# Patient Record
Sex: Female | Born: 1980
Health system: Southern US, Community
[De-identification: ages and names within clinical notes are randomized; demographics above are authoritative.]

## PROBLEM LIST (undated history)

## (undated) DIAGNOSIS — G43909 Migraine, unspecified, not intractable, without status migrainosus: Secondary | ICD-10-CM

## (undated) DIAGNOSIS — E669 Obesity, unspecified: Secondary | ICD-10-CM

## (undated) DIAGNOSIS — E785 Hyperlipidemia, unspecified: Secondary | ICD-10-CM

## (undated) DIAGNOSIS — I10 Essential (primary) hypertension: Secondary | ICD-10-CM

## (undated) DIAGNOSIS — K5090 Crohn's disease, unspecified, without complications: Secondary | ICD-10-CM

## (undated) DIAGNOSIS — E559 Vitamin D deficiency, unspecified: Secondary | ICD-10-CM

## (undated) HISTORY — PX: APPENDECTOMY: SHX54

## (undated) HISTORY — DX: Hyperlipidemia, unspecified: E78.5

## (undated) HISTORY — DX: Obesity, unspecified: E66.9

## (undated) HISTORY — DX: Crohn's disease, unspecified, without complications: K50.90

## (undated) HISTORY — DX: Essential (primary) hypertension: I10

## (undated) HISTORY — DX: Vitamin D deficiency, unspecified: E55.9

## (undated) HISTORY — DX: Migraine, unspecified, not intractable, without status migrainosus: G43.909

---

## 2006-12-13 ENCOUNTER — Encounter: Admission: RE | Admit: 2006-12-13 | Discharge: 2006-12-13 | Payer: Self-pay | Admitting: Internal Medicine

## 2007-01-08 ENCOUNTER — Ambulatory Visit: Payer: Self-pay | Admitting: Internal Medicine

## 2007-01-08 LAB — CONVERTED CEMR LAB
Crystals: NEGATIVE
Hemoglobin, Urine: NEGATIVE
Mucus, UA: NEGATIVE
Total Protein, Urine: NEGATIVE mg/dL
pH: 6 (ref 5.0–8.0)

## 2007-01-10 ENCOUNTER — Ambulatory Visit: Payer: Self-pay | Admitting: Gastroenterology

## 2007-01-29 ENCOUNTER — Ambulatory Visit: Payer: Self-pay | Admitting: Internal Medicine

## 2007-11-21 ENCOUNTER — Inpatient Hospital Stay (HOSPITAL_COMMUNITY): Admission: AD | Admit: 2007-11-21 | Discharge: 2007-11-24 | Payer: Self-pay | Admitting: Obstetrics & Gynecology

## 2010-11-29 NOTE — Assessment & Plan Note (Signed)
[facility]                         GASTROENTEROLOGY OFFICE NOTE   JANNINE, ABREU                      MRN:          [id number]  DATE:01/08/2007                            DOB:          [birth year]    REFERRING PHYSICIAN:  Geoffry Paradise, M.D.   REASON FOR CONSULTATION:  Right-sided pain.   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 Jacky Kindle regarding persistent  right-sided pain. She is accompanied today by her husband. The patient  reports developing right side to right flank pain about 2 months ago.  She describes the discomfort as occurring approximately 30 minutes after  awakening. The discomfort can vary during the day. It seems to be worse  with exertion or activity. It seems to be improved with lying down and  applying direct pressure such as a pillow. The pain does not awake her.  It is not affected by meals or stress. She denies nausea, vomiting,  screw abdominal pain, change in bowel habits, fevers, bleeding, or  urinary complaints. She has taken ibuprofen which has not been helpful.  There does not seem to be any change related to her menstrual period.  She saw Dr. Jacky Kindle on May 12 and underwent laboratory testing.  Comprehensive metabolic panel including liver function tests and CBC  were normal. Urinalysis revealed 10-15 white blood cells as well as 5-10  red blood cells, small nitrate and 2+ bacteria. A contrast-enhanced CT  scan of the abdomen and pelvis was performed Dec 13, 2006. The abdomen  revealed a questionable abnormality of the ascending colon with possible  narrowing or slight mucosal thickening.  No surrounding inflammatory  change. The pelvis was unremarkable. The patient denies trauma or prior  history of similar pain.   PAST MEDICAL HISTORY:  None.   PAST SURGICAL HISTORY:  Appendectomy in 1983.   ALLERGIES:  No known drug allergies.   CURRENT MEDICATIONS:  None.   FAMILY HISTORY:  Mother with ulcerative colitis and father with heart  disease and diabetes.   SOCIAL HISTORY:  The patient is married. She is accompanied by her  husband Homer. They do not have children. 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 She does not smoke or use alcohol.   REVIEW OF SYSTEMS:  Remarkable for back pain. Her last menstrual period  began December 25, 2006 and it was normal.   PHYSICAL EXAMINATION:  GENERAL:  Well-appearing female in no acute  distress.  VITAL SIGNS:  Blood pressure is 116/70, heart rate is 90, weight is  181.6 pounds. She is 5 feet 2 inches in height.  HEENT:  Sclera are anicteric, conjunctiva are pink. Oral mucosa is  intact. There is no adenopathy.  LUNGS:  Clear.  HEART:  Regular.  ABDOMEN:  Soft, nontender, nondistended with good bowel sounds. No  organomegaly, mass or hernia. There is no pain to percussion in the  flanks or to palpation along the spine.  EXTREMITIES:  Without edema.  IMPRESSION:  This is a 30 year old female with a 6-8 week history of  right-sided and right flank pain. No true abdominal pain. The problem  does seem to have a musculoskeletal component. There is however a  question of an abnormality of the right colon on CT imaging. This  appears soft. However, given the persistent nature of her pain as well  as her family history of inflammatory bowel disease, I feel further  investigation is indicated. The only other issue is a prior abnormal  urinalysis of uncertain significance.   RECOMMENDATIONS:  1. Repeat urinalysis.  2. Abdominal ultrasound to rule out gallstones.  3. Schedule colonoscopy to clarify CT scan abnormality and exclude      colonic cause for pain. The nature of the procedure as well as the      risks, benefits, and alternatives were reviewed. She understood and      agreed to proceed.  4. If GI  workup negative then consider possible musculoskeletal cause      such as back or spine disease.     Wilhemina Bonito. Marina Goodell, MD  Electronically Signed    JNP/MedQ  DD: 01/10/2007  DT: 01/11/2007  Job #: [id number]   cc:   Geoffry Paradise, M.D.

## 2013-03-13 ENCOUNTER — Other Ambulatory Visit: Payer: Self-pay

## 2013-03-13 ENCOUNTER — Encounter (HOSPITAL_COMMUNITY): Payer: Self-pay | Admitting: Emergency Medicine

## 2013-03-13 ENCOUNTER — Emergency Department (HOSPITAL_COMMUNITY): Payer: 59

## 2013-03-13 ENCOUNTER — Emergency Department (HOSPITAL_COMMUNITY)
Admission: EM | Admit: 2013-03-13 | Discharge: 2013-03-13 | Disposition: A | Discharge status: Home / Self Care | Payer: 59 | Attending: Emergency Medicine | Admitting: Emergency Medicine

## 2013-03-13 DIAGNOSIS — Z79899 Other long term (current) drug therapy: Secondary | ICD-10-CM | POA: Insufficient documentation

## 2013-03-13 DIAGNOSIS — R0602 Shortness of breath: Secondary | ICD-10-CM | POA: Insufficient documentation

## 2013-03-13 DIAGNOSIS — R002 Palpitations: Secondary | ICD-10-CM | POA: Insufficient documentation

## 2013-03-13 DIAGNOSIS — M546 Pain in thoracic spine: Secondary | ICD-10-CM | POA: Insufficient documentation

## 2013-03-13 LAB — CBC WITH DIFFERENTIAL/PLATELET
Eosinophils Absolute: 0.2 10*3/uL (ref 0.0–0.7)
Hemoglobin: 14 g/dL (ref 12.0–15.0)
Lymphs Abs: 2.9 10*3/uL (ref 0.7–4.0)
MCH: 29.9 pg (ref 26.0–34.0)
Monocytes Relative: 4 % (ref 3–12)
Neutrophils Relative %: 69 % (ref 43–77)
RBC: 4.68 MIL/uL (ref 3.87–5.11)

## 2013-03-13 LAB — COMPREHENSIVE METABOLIC PANEL
Alkaline Phosphatase: 56 U/L (ref 39–117)
BUN: 8 mg/dL (ref 6–23)
Chloride: 102 mEq/L (ref 96–112)
GFR calc Af Amer: >90 mL/min (ref >90)
GFR calc non Af Amer: >90 mL/min (ref >90)
Glucose, Bld: 86 mg/dL (ref 70–99)
Potassium: 3.3 mEq/L — ABNORMAL LOW (ref 3.5–5.1)
Total Bilirubin: 0.3 mg/dL (ref 0.3–1.2)
Total Protein: 7.7 g/dL (ref 6.0–8.3)

## 2013-03-13 LAB — POCT I-STAT TROPONIN I: Troponin i, poc: 0 ng/mL (ref 0.00–0.08)

## 2013-03-13 NOTE — ED Provider Notes (Signed)
Medical screening examination/treatment/procedure(s) were performed by non-physician practitioner and as supervising physician I was immediately available for consultation/collaboration.   Junius Argyle, MD 03/13/13 1958

## 2013-03-13 NOTE — ED Provider Notes (Signed)
CSN: [id number]     Arrival date & time 03/13/13  1141 History   First MD Initiated Contact with Patient 03/13/13 1154     Chief Complaint  Patient presents with  . Palpitations  . Back Pain   (Consider location/radiation/quality/duration/timing/severity/associated sxs/prior Treatment) HPI Comments: Patient presents to the emergency department with chief complaint of palpitations. She states that she has noticed increasingly frequent heart palpitations times one month. She states that she has also noticed that she becomes short of breath with exertion for the past month. She states that she has also had some upper back pain, that radiates to the shoulder blade. She states that she has a family history of early heart disease. She does not have any other cardiac risk factors. She states that nothing makes her symptoms better or worse. She has not tried any medications for symptoms. He denies being in pain at this time. She denies chest pain, abdominal pain, urinary complaints, or other complaints.  The history is provided by the patient. No language interpreter was used.    History reviewed. No pertinent past medical history. Past Surgical History  Procedure Laterality Date  . Appendectomy     No family history on file. History  Substance Use Topics  . Smoking status: Not on file  . Smokeless tobacco: Not on file  . Alcohol Use: Not on file   OB History   Grav Para Term Preterm Abortions TAB SAB Ect Mult Living                 Review of Systems  All other systems reviewed and are negative.    Allergies  Review of patient's allergies indicates no known allergies.  Home Medications   Current Outpatient Rx  Name  Route  Sig  Dispense  Refill  . desogestrel-ethinyl estradiol (KARIVA,AZURETTE,MIRCETTE) 0.15-0.02/0.01 MG (21/5) tablet   Oral   Take 1 tablet by mouth daily.         . naproxen sodium (ANAPROX) 220 MG tablet   Oral   Take 440 mg by mouth 2 (two) times daily as  needed (for pain).          BP 121/99  Pulse 89  Temp(Src) 98.5 F (36.9 C) (Oral)  SpO2 100%  LMP 03/11/2013 Physical Exam  Nursing note and vitals reviewed. Constitutional: She is oriented to person, place, and time. She appears well-developed and well-nourished.  HENT:  Head: Normocephalic and atraumatic.  Eyes: Conjunctivae and EOM are normal. Pupils are equal, round, and reactive to light.  Neck: Normal range of motion. Neck supple.  Cardiovascular: Normal rate and regular rhythm.  Exam reveals no gallop and no friction rub.   No murmur heard. Pulmonary/Chest: Effort normal and breath sounds normal. No respiratory distress. She has no wheezes. She has no rales. She exhibits no tenderness.  Abdominal: Soft. Bowel sounds are normal. She exhibits no distension and no mass. There is no tenderness. There is no rebound and no guarding.  Musculoskeletal: Normal range of motion. She exhibits no edema and no tenderness.  Neurological: She is alert and oriented to person, place, and time.  Skin: Skin is warm and dry.  Psychiatric: She has a normal mood and affect. Her behavior is normal. Judgment and thought content normal.    ED Course  Procedures (including critical care time) Labs Review Labs Reviewed  CBC WITH DIFFERENTIAL  COMPREHENSIVE METABOLIC PANEL  ED ECG REPORT  I personally interpreted this EKG   Date: 03/13/2013   Rate:  95  Rhythm: normal sinus rhythm  QRS Axis: normal  Intervals: normal  ST/T Wave abnormalities: normal  Conduction Disutrbances:none  Narrative Interpretation:   Old EKG Reviewed: none available   Results for orders placed during the hospital encounter of 03/13/13  CBC WITH DIFFERENTIAL      Result Value Range   WBC 11.5 (*) 4.0 - 10.5 K/uL   RBC 4.68  3.87 - 5.11 MIL/uL   Hemoglobin 14.0  12.0 - 15.0 g/dL   HCT 16.1  09.6 - 04.5 %   MCV 89.3  78.0 - 100.0 fL   MCH 29.9  26.0 - 34.0 pg   MCHC 33.5  30.0 - 36.0 g/dL   RDW 40.9  81.1 - 91.4  %   Platelets 238  150 - 400 K/uL   Neutrophils Relative % 69  43 - 77 %   Neutro Abs 7.9 (*) 1.7 - 7.7 K/uL   Lymphocytes Relative 26  12 - 46 %   Lymphs Abs 2.9  0.7 - 4.0 K/uL   Monocytes Relative 4  3 - 12 %   Monocytes Absolute 0.5  0.1 - 1.0 K/uL   Eosinophils Relative 1  0 - 5 %   Eosinophils Absolute 0.2  0.0 - 0.7 K/uL   Basophils Relative 0  0 - 1 %   Basophils Absolute 0.1  0.0 - 0.1 K/uL  COMPREHENSIVE METABOLIC PANEL      Result Value Range   Sodium 138  135 - 145 mEq/L   Potassium 3.3 (*) 3.5 - 5.1 mEq/L   Chloride 102  96 - 112 mEq/L   CO2 24  19 - 32 mEq/L   Glucose, Bld 86  70 - 99 mg/dL   BUN 8  6 - 23 mg/dL   Creatinine, Ser 7.82  0.50 - 1.10 mg/dL   Calcium 9.4  8.4 - 95.6 mg/dL   Total Protein 7.7  6.0 - 8.3 g/dL   Albumin 3.9  3.5 - 5.2 g/dL   AST 19  0 - 37 U/L   ALT 20  0 - 35 U/L   Alkaline Phosphatase 56  39 - 117 U/L   Total Bilirubin 0.3  0.3 - 1.2 mg/dL   GFR calc non Af Amer >90  >90 mL/min   GFR calc Af Amer >90  >90 mL/min  D-DIMER, QUANTITATIVE      Result Value Range   D-Dimer, Quant <0.27  0.00 - 0.48 ug/mL-FEU  POCT I-STAT TROPONIN I      Result Value Range   Troponin i, poc 0.00  0.00 - 0.08 ng/mL   Comment 3            Dg Chest 2 View  03/13/2013   *RADIOLOGY REPORT*  Clinical Data: Shortness of breath, palpitations  CHEST - 2 VIEW  Comparison: None.  Findings: Cardiomediastinal silhouette is unremarkable.  Minimal dextroscoliosis.  No acute infiltrate or pleural effusion.  No pulmonary edema.  IMPRESSION: No active disease.   Original Report Authenticated By: Natasha Mead, M.D.      MDM   1. Palpitations    Patient with one-month history of heart palpitations. Cardiac risk factors only include positive family history. Doubt PE, no chest pain or hypoxia, no recent travel or surgery.  Labs are reassuring.  Will recommend follow-up with cardiology. Discussed the patient with Dr. Romeo Apple, who agrees with the plan.  Heart score is 1.   Low Score (0-3 points), risk of MACE of 0.9-1.7%.  Molly Maduro  Dahlia Client, PA-C 03/13/13 [phone]

## 2013-03-13 NOTE — ED Notes (Signed)
Pt states that for approx 1 month now she feels palpatations and upper back pain to shoulder blade area, denies any hx, pain intermit. No sob,

## 2014-04-21 ENCOUNTER — Encounter (HOSPITAL_COMMUNITY): Payer: Self-pay | Admitting: Emergency Medicine

## 2014-04-21 ENCOUNTER — Emergency Department (HOSPITAL_COMMUNITY)
Admission: EM | Admit: 2014-04-21 | Discharge: 2014-04-21 | Disposition: A | Discharge status: Home / Self Care | Payer: 59 | Source: Home / Self Care

## 2014-04-21 DIAGNOSIS — R0982 Postnasal drip: Secondary | ICD-10-CM

## 2014-04-21 DIAGNOSIS — J9801 Acute bronchospasm: Secondary | ICD-10-CM

## 2014-04-21 DIAGNOSIS — J069 Acute upper respiratory infection, unspecified: Secondary | ICD-10-CM

## 2014-04-21 MED ORDER — GUAIFENESIN-CODEINE 100-10 MG/5ML PO SYRP: ORAL_SOLUTION | ORAL | Status: DC

## 2014-04-21 MED ORDER — ALBUTEROL SULFATE HFA 108 (90 BASE) MCG/ACT IN AERS: 2.0000 | INHALATION_SPRAY | RESPIRATORY_TRACT | Status: DC | PRN

## 2014-04-21 NOTE — ED Provider Notes (Signed)
CSN: [id number]     Arrival date & time 04/21/14  [id number] History   First MD Initiated Contact with Patient 04/21/14 [id number]     Chief Complaint  Patient presents with  . Cough   (Consider location/radiation/quality/duration/timing/severity/associated sxs/prior Treatment) HPI Comments: Cough for 1 week, worse at night. Associated with body aches and fever 3-4 days ago. Taking Mucinex DM and Ibuprofen. No hx of asthma or smoking.   History reviewed. No pertinent past medical history. Past Surgical History  Procedure Laterality Date  . Appendectomy     No family history on file. History  Substance Use Topics  . Smoking status: Never Smoker   . Smokeless tobacco: Not on file  . Alcohol Use: No   OB History   Grav Para Term Preterm Abortions TAB SAB Ect Mult Living                 Review of Systems  Constitutional: Negative for fever, chills, activity change, appetite change and fatigue.  HENT: Positive for congestion and postnasal drip. Negative for facial swelling, rhinorrhea and sore throat.   Eyes: Negative.   Respiratory: Positive for cough. Negative for choking, chest tightness and shortness of breath.   Cardiovascular: Negative.   Gastrointestinal: Negative.   Musculoskeletal: Negative for neck pain and neck stiffness.  Skin: Negative for pallor and rash.  Neurological: Negative.     Allergies  Review of patient's allergies indicates no known allergies.  Home Medications   Prior to Admission medications   Medication Sig Start Date End Date Taking? Authorizing Provider  albuterol (PROVENTIL HFA;VENTOLIN HFA) 108 (90 BASE) MCG/ACT inhaler Inhale 2 puffs into the lungs every 4 (four) hours as needed for wheezing or shortness of breath. 04/21/14   Hayden Rasmussenavid Rashan Rounsaville, NP  desogestrel-ethinyl estradiol (KARIVA,AZURETTE,MIRCETTE) 0.15-0.02/0.01 MG (21/5) tablet Take 1 tablet by mouth daily.    Historical Provider, MD  guaiFENesin-codeine (CHERATUSSIN AC) 100-10 MG/5ML syrup 1-2 tsp q  4h prn cough. 04/21/14   Hayden Rasmussenavid Charika Mikelson, NP  naproxen sodium (ANAPROX) 220 MG tablet Take 440 mg by mouth 2 (two) times daily as needed (for pain).    Historical Provider, MD   BP 149/85  Pulse 109  Temp(Src) 98.8 F (37.1 C) (Oral)  Resp 16  SpO2 98%  LMP 03/31/2014 Physical Exam  Nursing note and vitals reviewed. Constitutional: She is oriented to person, place, and time. She appears well-developed and well-nourished. No distress.  HENT:  Bilat TM's nl OP with minor erythema , cobblestoning and scant, clear PND  Neck: Normal range of motion. Neck supple.  Cardiovascular: Normal rate and regular rhythm.   Pulmonary/Chest: Effort normal and breath sounds normal. No respiratory distress.  Faint wheeze with forced cough.  Musculoskeletal: Normal range of motion. She exhibits no edema.  Lymphadenopathy:    She has no cervical adenopathy.  Neurological: She is alert and oriented to person, place, and time.  Skin: Skin is warm and dry. No rash noted.  Psychiatric: She has a normal mood and affect.    ED Course  Procedures (including critical care time) Labs Review Labs Reviewed - No data to display  Imaging Review No results found.   MDM   1. Cough due to bronchospasm   2. PND (post-nasal drip)   3. URI (upper respiratory infection)     Cheratussin ac Albuterol HFA Allegra Lots of fluids    Hayden Rasmussenavid Lior Hoen, NP 04/21/14 [phone]

## 2014-04-21 NOTE — ED Provider Notes (Signed)
Medical screening examination/treatment/procedure(s) were performed by resident physician or non-physician practitioner and as supervising physician I was immediately available for consultation/collaboration.   Kiel Cockerell DOUGLAS MD.   Carina Chaplin D Locklan Canoy, MD 04/21/14 1137 

## 2014-04-21 NOTE — ED Notes (Signed)
Reports cough and fever, symptoms started 10/1.  Denies ear pain.  Throat hurts with coughing.  Cough keeps her awake at night.  Reports fever 100.8 on 10/1.  Reports thick mucous.

## 2014-04-21 NOTE — Discharge Instructions (Signed)
Bronchospasm A bronchospasm is when the tubes that carry air in and out of your lungs (airways) spasm or tighten. During a bronchospasm it is hard to breathe. This is because the airways get smaller. A bronchospasm can be triggered by:  Allergies. These may be to animals, pollen, food, or mold.  Infection. This is a common cause of bronchospasm.  Exercise.  Irritants. These include pollution, cigarette smoke, strong odors, aerosol sprays, and paint fumes.  Weather changes.  Stress.  Being emotional. HOME CARE   Always have a plan for getting help. Know when to call your doctor and local emergency services (911 in the [place] Know where you can get emergency care.  Only take medicines as told by your doctor.  If you were prescribed an inhaler or nebulizer machine, ask your doctor how to use it correctly. Always use a spacer with your inhaler if you were given one.  Stay calm during an attack. Try to relax and breathe more slowly.  Control your home environment:  Change your heating and air conditioning filter at least once a month.  Limit your use of fireplaces and wood stoves.  Do not  smoke. Do not  allow smoking in your home.  Avoid perfumes and fragrances.  Get rid of pests (such as roaches and mice) and their droppings.  Throw away plants if you see mold on them.  Keep your house clean and dust free.  Replace carpet with wood, tile, or vinyl flooring. Carpet can trap dander and dust.  Use allergy-proof pillows, mattress covers, and box spring covers.  Wash bed sheets and blankets every week in hot water. Dry them in a dryer.  Use blankets that are made of polyester or cotton.  Wash hands frequently. GET HELP IF:  You have muscle aches.  You have chest pain.  The thick spit you spit or cough up (sputum) changes from clear or white to yellow, green, gray, or bloody.  The thick spit you spit or cough up gets thicker.  There are problems that may be related  to the medicine you are given such as:  A rash.  Itching.  Swelling.  Trouble breathing. GET HELP RIGHT AWAY IF:  You feel you cannot breathe or catch your breath.  You cannot stop coughing.  Your treatment is not helping you breathe better.  You have very bad chest pain. MAKE SURE YOU:   Understand these instructions.  Will watch your condition.  Will get help right away if you are not doing well or get worse. Document Released: 04/30/2009 Document Revised: 07/08/2013 Document Reviewed: 12/24/2012 [facility] Patient Information 2015 [place], [place]. This information is not intended to replace advice given to you by your health care provider. Make sure you discuss any questions you have with your health care provider.  Cough, Adult  A cough is a reflex. It helps you clear your throat and airways. A cough can help heal your body. A cough can last 2 or 3 weeks (acute) or may last more than 8 weeks (chronic). Some common causes of a cough can include an infection, allergy, or a cold. HOME CARE  Only take medicine as told by your doctor.  If given, take your medicines (antibiotics) as told. Finish them even if you start to feel better.  Use a cold steam vaporizer or humidifier in your home. This can help loosen thick spit (secretions).  Sleep so you are almost sitting up (semi-upright). Use pillows to do this. This helps reduce coughing.  Rest as needed.  Stop smoking if you smoke. GET HELP RIGHT AWAY IF:  You have yellowish-white fluid (pus) in your thick spit.  Your cough gets worse.  Your medicine does not reduce coughing, and you are losing sleep.  You cough up blood.  You have trouble breathing.  Your pain gets worse and medicine does not help.  You have a fever. MAKE SURE YOU:   Understand these instructions.  Will watch your condition.  Will get help right away if you are not doing well or get worse. Document Released: 03/16/2011 Document Revised:  11/17/2013 Document Reviewed: 03/16/2011 [facility] Patient Information 2015 [place], [place]. This information is not intended to replace advice given to you by your health care provider. Make sure you discuss any questions you have with your health care provider.  How to Use an Inhaler Using your inhaler correctly is very important. Good technique will make sure that the medicine reaches your lungs.  HOW TO USE AN INHALER: 1. Take the cap off the inhaler. 2. If this is the first time using your inhaler, you need to prime it. Shake the inhaler for 5 seconds. Release four puffs into the air, away from your face. Ask your doctor for help if you have questions. 3. Shake the inhaler for 5 seconds. 4. Turn the inhaler so the bottle is above the mouthpiece. 5. Put your pointer finger on top of the bottle. Your thumb holds the bottom of the inhaler. 6. Open your mouth. 7. Either hold the inhaler away from your mouth (the width of 2 fingers) or place your lips tightly around the mouthpiece. Ask your doctor which way to use your inhaler. 8. Breathe out as much air as possible. 9. Breathe in and push down on the bottle 1 time to release the medicine. You will feel the medicine go in your mouth and throat. 10. Continue to take a deep breath in very slowly. Try to fill your lungs. 11. After you have breathed in completely, hold your breath for 10 seconds. This will help the medicine to settle in your lungs. If you cannot hold your breath for 10 seconds, hold it for as long as you can before you breathe out. 12. Breathe out slowly, through pursed lips. Whistling is an example of pursed lips. 13. If your doctor has told you to take more than 1 puff, wait at least 15-30 seconds between puffs. This will help you get the best results from your medicine. Do not use the inhaler more than your doctor tells you to. 14. Put the cap back on the inhaler. 15. Follow the directions from your doctor or from the inhaler  package about cleaning the inhaler. If you use more than one inhaler, ask your doctor which inhalers to use and what order to use them in. Ask your doctor to help you figure out when you will need to refill your inhaler.  If you use a steroid inhaler, always rinse your mouth with water after your last puff, gargle and spit out the water. Do not swallow the water. GET HELP IF:  The inhaler medicine only partially helps to stop wheezing or shortness of breath.  You are having trouble using your inhaler.  You have some increase in thick spit (phlegm). GET HELP RIGHT AWAY IF:  The inhaler medicine does not help your wheezing or shortness of breath or you have tightness in your chest.  You have dizziness, headaches, or fast heart rate.  You have chills, fever,  or night sweats.  You have a large increase of thick spit, or your thick spit is bloody. MAKE SURE YOU:   Understand these instructions.  Will watch your condition.  Will get help right away if you are not doing well or get worse. Document Released: 04/11/2008 Document Revised: 04/23/2013 Document Reviewed: 01/30/2013 [facility] Patient Information 2015 [place], [place]. This information is not intended to replace advice given to you by your health care provider. Make sure you discuss any questions you have with your health care provider.  Upper Respiratory Infection, Adult An upper respiratory infection (URI) is also known as the common cold. It is often caused by a type of germ (virus). Colds are easily spread (contagious). You can pass it to others by kissing, coughing, sneezing, or drinking out of the same glass. Usually, you get better in 1 or 2 weeks.  HOME CARE   Only take medicine as told by your doctor.  Use a warm mist humidifier or breathe in steam from a hot shower.  Drink enough water and fluids to keep your pee (urine) clear or pale yellow.  Get plenty of rest.  Return to work when your temperature is back to  normal or as told by your doctor. You may use a face mask and wash your hands to stop your cold from spreading. GET HELP RIGHT AWAY IF:   After the first few days, you feel you are getting worse.  You have questions about your medicine.  You have chills, shortness of breath, or brown or red spit (mucus).  You have yellow or brown snot (nasal discharge) or pain in the face, especially when you bend forward.  You have a fever, puffy (swollen) neck, pain when you swallow, or white spots in the back of your throat.  You have a bad headache, ear pain, sinus pain, or chest pain.  You have a high-pitched whistling sound when you breathe in and out (wheezing).  You have a lasting cough or cough up blood.  You have sore muscles or a stiff neck. MAKE SURE YOU:   Understand these instructions.  Will watch your condition.  Will get help right away if you are not doing well or get worse. Document Released: 12/20/2007 Document Revised: 09/25/2011 Document Reviewed: 10/08/2013 [facility] Patient Information 2015 [place], [place]. This information is not intended to replace advice given to you by your health care provider. Make sure you discuss any questions you have with your health care provider.

## 2016-04-14 DIAGNOSIS — Z6831 Body mass index (BMI) 31.0-31.9, adult: Secondary | ICD-10-CM | POA: Diagnosis not present

## 2016-04-14 DIAGNOSIS — Z23 Encounter for immunization: Secondary | ICD-10-CM | POA: Diagnosis not present

## 2016-04-14 DIAGNOSIS — Z01419 Encounter for gynecological examination (general) (routine) without abnormal findings: Secondary | ICD-10-CM | POA: Diagnosis not present

## 2016-10-12 DIAGNOSIS — Z0000 Encounter for general adult medical examination without abnormal findings: Secondary | ICD-10-CM | POA: Diagnosis not present

## 2016-10-17 DIAGNOSIS — I10 Essential (primary) hypertension: Secondary | ICD-10-CM | POA: Diagnosis not present

## 2016-10-17 DIAGNOSIS — E668 Other obesity: Secondary | ICD-10-CM | POA: Diagnosis not present

## 2016-10-17 DIAGNOSIS — Z1389 Encounter for screening for other disorder: Secondary | ICD-10-CM | POA: Diagnosis not present

## 2016-10-17 DIAGNOSIS — Z0000 Encounter for general adult medical examination without abnormal findings: Secondary | ICD-10-CM | POA: Diagnosis not present

## 2016-10-17 DIAGNOSIS — Z6832 Body mass index (BMI) 32.0-32.9, adult: Secondary | ICD-10-CM | POA: Diagnosis not present

## 2017-03-30 DIAGNOSIS — E669 Obesity, unspecified: Secondary | ICD-10-CM | POA: Diagnosis not present

## 2017-03-30 DIAGNOSIS — Z6832 Body mass index (BMI) 32.0-32.9, adult: Secondary | ICD-10-CM | POA: Diagnosis not present

## 2017-03-30 DIAGNOSIS — Z23 Encounter for immunization: Secondary | ICD-10-CM | POA: Diagnosis not present

## 2017-03-30 DIAGNOSIS — I10 Essential (primary) hypertension: Secondary | ICD-10-CM | POA: Diagnosis not present

## 2017-06-11 ENCOUNTER — Other Ambulatory Visit: Payer: Self-pay

## 2017-06-11 MED ORDER — DESOGESTREL-ETHINYL ESTRADIOL 0.15-0.02/0.01 MG (21/5) PO TABS: 1.0000 | ORAL_TABLET | Freq: Every day | ORAL | 0 refills | Status: DC

## 2017-06-12 DIAGNOSIS — R509 Fever, unspecified: Secondary | ICD-10-CM | POA: Diagnosis not present

## 2017-06-12 DIAGNOSIS — J029 Acute pharyngitis, unspecified: Secondary | ICD-10-CM | POA: Diagnosis not present

## 2017-06-12 DIAGNOSIS — J020 Streptococcal pharyngitis: Secondary | ICD-10-CM | POA: Diagnosis not present

## 2017-06-12 DIAGNOSIS — H6121 Impacted cerumen, right ear: Secondary | ICD-10-CM | POA: Diagnosis not present

## 2017-07-12 ENCOUNTER — Encounter: Payer: Self-pay | Admitting: Obstetrics & Gynecology

## 2017-07-12 ENCOUNTER — Other Ambulatory Visit: Payer: Self-pay | Admitting: Obstetrics & Gynecology

## 2017-07-16 ENCOUNTER — Telehealth: Payer: Self-pay | Admitting: *Deleted

## 2017-07-16 MED ORDER — DESOGESTREL-ETHINYL ESTRADIOL 0.15-0.02/0.01 MG (21/5) PO TABS: 1.0000 | ORAL_TABLET | Freq: Every day | ORAL | 1 refills | Status: DC

## 2017-07-16 NOTE — Telephone Encounter (Signed)
Patient has annual scheduled on 08/23/17, needs refill on Kariva birth control pills, paper chart arrived, Rx sent.

## 2017-08-23 ENCOUNTER — Encounter: Payer: Self-pay | Admitting: Obstetrics & Gynecology

## 2017-09-04 ENCOUNTER — Ambulatory Visit (INDEPENDENT_AMBULATORY_CARE_PROVIDER_SITE_OTHER): Payer: BLUE CROSS/BLUE SHIELD | Admitting: Obstetrics & Gynecology

## 2017-09-04 ENCOUNTER — Encounter: Payer: Self-pay | Admitting: Obstetrics & Gynecology

## 2017-09-04 VITALS — BP 122/78 | Ht 62.0 in | Wt 182.0 lb

## 2017-09-04 DIAGNOSIS — Z01419 Encounter for gynecological examination (general) (routine) without abnormal findings: Secondary | ICD-10-CM | POA: Diagnosis not present

## 2017-09-04 DIAGNOSIS — Z3041 Encounter for surveillance of contraceptive pills: Secondary | ICD-10-CM | POA: Diagnosis not present

## 2017-09-04 DIAGNOSIS — G43829 Menstrual migraine, not intractable, without status migrainosus: Secondary | ICD-10-CM

## 2017-09-04 MED ORDER — DESOGESTREL-ETHINYL ESTRADIOL 0.15-0.02/0.01 MG (21/5) PO TABS: 1.0000 | ORAL_TABLET | Freq: Every day | ORAL | 4 refills | Status: DC

## 2017-09-04 NOTE — Progress Notes (Signed)
Sharon Russo 11-10-1980 [id number]   History:    37 y.o. G1P1L1  Married.  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  Son is 659 1/39 yo, doing very well.  They constructed a house in the backyard, enjoying it.  RP:  Established patient presenting for annual gyn exam   HPI: Well on Kariva, except for withdrawal migraines.  No pelvic pain.  Normal vaginal secretions.  No pain with intercourse.  Urine and bowel movements normal.  Breasts normal.  Body mass index 33.29.  Exercises regularly, planning to bike with her son.  Family physician for health labs.  Past medical history,surgical history, family history and social history were all reviewed and documented in the EPIC chart.  Gynecologic History Patient's last menstrual period was 08/04/2017. Contraception: OCP (estrogen/progesterone) Last Pap: 02/2015. Results were: Negative/HPV HR neg Last mammogram: Never Bone Density: Never Colonoscopy: Never  Obstetric History OB History  Gravida Para Term Preterm AB Living  1       0 1  SAB TAB Ectopic Multiple Live Births      0        # Outcome Date GA Lbr Len/2nd Weight Sex Delivery Anes PTL Lv  1 Gravida                ROS: A ROS was performed and pertinent positives and negatives are included in the history.  GENERAL: No fevers or chills. HEENT: No change in vision, no earache, sore throat or sinus congestion. NECK: No pain or stiffness. CARDIOVASCULAR: No chest pain or pressure. No palpitations. PULMONARY: No shortness of breath, cough or wheeze. GASTROINTESTINAL: No abdominal pain, nausea, vomiting or diarrhea, melena or bright red blood per rectum. GENITOURINARY: No urinary frequency, urgency, hesitancy or dysuria. MUSCULOSKELETAL: No joint or muscle pain, no back pain, no recent trauma. DERMATOLOGIC: No rash, no itching, no lesions. ENDOCRINE: No polyuria, polydipsia, no heat or cold intolerance. No recent change in weight. HEMATOLOGICAL: No anemia or easy bruising or bleeding. NEUROLOGIC: No headache,  seizures, numbness, tingling or weakness. PSYCHIATRIC: No depression, no loss of interest in normal activity or change in sleep pattern.     Exam:   BP 122/78 (BP Location: Right Arm, Patient Position: Sitting, Cuff Size: Normal)   Ht 5\' 2"  (1.575 m)   Wt 182 lb (82.6 kg)   LMP 08/04/2017   BMI 33.29 kg/m   Body mass index is 33.29 kg/m.  General appearance : Well developed well nourished female. No acute distress HEENT: Eyes: no retinal hemorrhage or exudates,  Neck supple, trachea midline, no carotid bruits, no thyroidmegaly Lungs: Clear to auscultation, no rhonchi or wheezes, or rib retractions  Heart: Regular rate and rhythm, no murmurs or gallops Breast:Examined in sitting and supine position were symmetrical in appearance, no palpable masses or tenderness,  no skin retraction, no nipple inversion, no nipple discharge, no skin discoloration, no axillary or supraclavicular lymphadenopathy Abdomen: no palpable masses or tenderness, no rebound or guarding Extremities: no edema or skin discoloration or tenderness  Pelvic: Vulva: Normal             Vagina: No gross lesions or discharge  Cervix: No gross lesions or discharge.  Pap reflex done  Uterus  AV, normal size, shape and consistency, non-tender and mobile  Adnexa  Without masses or tenderness  Anus: Normal   Assessment/Plan:  37 y.o. female for annual exam   1. Well female exam with routine gynecological exam Normal gynecologic exam.  Pap reflex done.  Breast exam  normal.  Health labs with family physician.  Increase physical activity to 5 times a week of aerobics and weight lifting every 2 days recommended.  No contraindication to birth control pills - Pap IG w/ reflex to HPV when ASC-U  2. Encounter for surveillance of contraceptive pills Will use continuously because of withdrawal migraines..  Prescription sent to pharmacy.  3. Menstrual migraine without status migrainosus, not intractable Will try to control with  continuous birth control pills.  Other orders - desogestrel-ethinyl estradiol (KARIVA,AZURETTE,MIRCETTE) 0.15-0.02/0.01 MG (21/5) tablet; Take 1 tablet by mouth daily. Will use continuously because of menstrual migraines  [name] MD, 3:46 PM 09/04/2017

## 2017-09-06 ENCOUNTER — Encounter: Payer: Self-pay | Admitting: Obstetrics & Gynecology

## 2017-09-06 NOTE — Patient Instructions (Signed)
1. Well female exam with routine gynecological exam Normal gynecologic exam.  Pap reflex done.  Breast exam normal.  Health labs with family physician.  Increase physical activity to 5 times a week of aerobics and weight lifting every 2 days recommended.  No contraindication to birth control pills - Pap IG w/ reflex to HPV when ASC-U  2. Encounter for surveillance of contraceptive pills Will use continuously because of withdrawal migraines..  Prescription sent to pharmacy.  3. Menstrual migraine without status migrainosus, not intractable Will try to control with continuous birth control pills.  Other orders - desogestrel-ethinyl estradiol (KARIVA,AZURETTE,MIRCETTE) 0.15-0.02/0.01 MG (21/5) tablet; Take 1 tablet by mouth daily. Will use continuously because of menstrual migraines  Sharon Russo, it was a pleasure seeing you today!  I will inform you of your results as soon as they are available.  Health Maintenance, Female Adopting a healthy lifestyle and getting preventive care can go a long way to promote health and wellness. Talk with your health care provider about what schedule of regular examinations is right for you. This is a good chance for you to check in with your provider about disease prevention and staying healthy. In between checkups, there are plenty of things you can do on your own. Experts have done a lot of research about which lifestyle changes and preventive measures are most likely to keep you healthy. Ask your health care provider for more information. Weight and diet Eat a healthy diet  Be sure to include plenty of vegetables, fruits, low-fat dairy products, and lean protein.  Do not eat a lot of foods high in solid fats, added sugars, or salt.  Get regular exercise. This is one of the most important things you can do for your health. ? Most adults should exercise for at least 150 minutes each week. The exercise should increase your heart rate and make you sweat  (moderate-intensity exercise). ? Most adults should also do strengthening exercises at least twice a week. This is in addition to the moderate-intensity exercise.  Maintain a healthy weight  Body mass index (BMI) is a measurement that can be used to identify possible weight problems. It estimates body fat based on height and weight. Your health care provider can help determine your BMI and help you achieve or maintain a healthy weight.  For females 36 years of age and older: ? A BMI below 18.5 is considered underweight. ? A BMI of 18.5 to 24.9 is normal. ? A BMI of 25 to 29.9 is considered overweight. ? A BMI of 30 and above is considered obese.  Watch levels of cholesterol and blood lipids  You should start having your blood tested for lipids and cholesterol at 39 years of age, then have this test every 5 years.  You may need to have your cholesterol levels checked more often if: ? Your lipid or cholesterol levels are high. ? You are older than 39 years of age. ? You are at high risk for heart disease.  Cancer screening Lung Cancer  Lung cancer screening is recommended for adults 100-4 years old who are at high risk for lung cancer because of a history of smoking.  A yearly low-dose CT scan of the lungs is recommended for people who: ? Currently smoke. ? Have quit within the past 15 years. ? Have at least a 30-pack-year history of smoking. A pack year is smoking an average of one pack of cigarettes a day for 1 year.  Yearly screening should continue until  it has been 15 years since you quit.  Yearly screening should stop if you develop a health problem that would prevent you from having lung cancer treatment.  Breast Cancer  Practice breast self-awareness. This means understanding how your breasts normally appear and feel.  It also means doing regular breast self-exams. Let your health care provider know about any changes, no matter how small.  If you are in your 20s or  30s, you should have a clinical breast exam (CBE) by a health care provider every 1-3 years as part of a regular health exam.  If you are 71 or older, have a CBE every year. Also consider having a breast X-ray (mammogram) every year.  If you have a family history of breast cancer, talk to your health care provider about genetic screening.  If you are at high risk for breast cancer, talk to your health care provider about having an MRI and a mammogram every year.  Breast cancer gene (BRCA) assessment is recommended for women who have family members with BRCA-related cancers. BRCA-related cancers include: ? Breast. ? Ovarian. ? Tubal. ? Peritoneal cancers.  Results of the assessment will determine the need for genetic counseling and BRCA1 and BRCA2 testing.  Cervical Cancer Your health care provider may recommend that you be screened regularly for cancer of the pelvic organs (ovaries, uterus, and vagina). This screening involves a pelvic examination, including checking for microscopic changes to the surface of your cervix (Pap test). You may be encouraged to have this screening done every 3 years, beginning at age 66.  For women ages 77-65, health care providers may recommend pelvic exams and Pap testing every 3 years, or they may recommend the Pap and pelvic exam, combined with testing for human papilloma virus (HPV), every 5 years. Some types of HPV increase your risk of cervical cancer. Testing for HPV may also be done on women of any age with unclear Pap test results.  Other health care providers may not recommend any screening for nonpregnant women who are considered low risk for pelvic cancer and who do not have symptoms. Ask your health care provider if a screening pelvic exam is right for you.  If you have had past treatment for cervical cancer or a condition that could lead to cancer, you need Pap tests and screening for cancer for at least 20 years after your treatment. If Pap tests  have been discontinued, your risk factors (such as having a new sexual partner) need to be reassessed to determine if screening should resume. Some women have medical problems that increase the chance of getting cervical cancer. In these cases, your health care provider may recommend more frequent screening and Pap tests.  Colorectal Cancer  This type of cancer can be detected and often prevented.  Routine colorectal cancer screening usually begins at 39 years of age and continues through 39 years of age.  Your health care provider may recommend screening at an earlier age if you have risk factors for colon cancer.  Your health care provider may also recommend using home test kits to check for hidden blood in the stool.  A small camera at the end of a tube can be used to examine your colon directly (sigmoidoscopy or colonoscopy). This is done to check for the earliest forms of colorectal cancer.  Routine screening usually begins at age 54.  Direct examination of the colon should be repeated every 5-10 years through 39 years of age. However, you may need to  be screened more often if early forms of precancerous polyps or small growths are found.  Skin Cancer  Check your skin from head to toe regularly.  Tell your health care provider about any new moles or changes in moles, especially if there is a change in a mole's shape or color.  Also tell your health care provider if you have a mole that is larger than the size of a pencil eraser.  Always use sunscreen. Apply sunscreen liberally and repeatedly throughout the day.  Protect yourself by wearing long sleeves, pants, a wide-brimmed hat, and sunglasses whenever you are outside.  Heart disease, diabetes, and high blood pressure  High blood pressure causes heart disease and increases the risk of stroke. High blood pressure is more likely to develop in: ? People who have blood pressure in the high end of the normal range (130-139/85-89 mm  Hg). ? People who are overweight or obese. ? People who are African American.  If you are 21-29 years of age, have your blood pressure checked every 3-5 years. If you are 45 years of age or older, have your blood pressure checked every year. You should have your blood pressure measured twice-once when you are at a hospital or clinic, and once when you are not at a hospital or clinic. Record the average of the two measurements. To check your blood pressure when you are not at a hospital or clinic, you can use: ? An automated blood pressure machine at a pharmacy. ? A home blood pressure monitor.  If you are between 18 years and 24 years old, ask your health care provider if you should take aspirin to prevent strokes.  Have regular diabetes screenings. This involves taking a blood sample to check your fasting blood sugar level. ? If you are at a normal weight and have a low risk for diabetes, have this test once every three years after 39 years of age. ? If you are overweight and have a high risk for diabetes, consider being tested at a younger age or more often. Preventing infection Hepatitis B  If you have a higher risk for hepatitis B, you should be screened for this virus. You are considered at high risk for hepatitis B if: ? You were born in a country where hepatitis B is common. Ask your health care provider which countries are considered high risk. ? Your parents were born in a high-risk country, and you have not been immunized against hepatitis B (hepatitis B vaccine). ? You have HIV or AIDS. ? You use needles to inject street drugs. ? You live with someone who has hepatitis B. ? You have had sex with someone who has hepatitis B. ? You get hemodialysis treatment. ? You take certain medicines for conditions, including cancer, organ transplantation, and autoimmune conditions.  Hepatitis C  Blood testing is recommended for: ? Everyone born from [birth year] through 1967. ? Anyone with known  risk factors for hepatitis C.  Sexually transmitted infections (STIs)  You should be screened for sexually transmitted infections (STIs) including gonorrhea and chlamydia if: ? You are sexually active and are younger than 39 years of age. ? You are older than 39 years of age and your health care provider tells you that you are at risk for this type of infection. ? Your sexual activity has changed since you were last screened and you are at an increased risk for chlamydia or gonorrhea. Ask your health care provider if you are at risk.  If you do not have HIV, but are at risk, it may be recommended that you take a prescription medicine daily to prevent HIV infection. This is called pre-exposure prophylaxis (PrEP). You are considered at risk if: ? You are sexually active and do not regularly use condoms or know the HIV status of your partner(s). ? You take drugs by injection. ? You are sexually active with a partner who has HIV.  Talk with your health care provider about whether you are at high risk of being infected with HIV. If you choose to begin PrEP, you should first be tested for HIV. You should then be tested every 3 months for as long as you are taking PrEP. Pregnancy  If you are premenopausal and you may become pregnant, ask your health care provider about preconception counseling.  If you may become pregnant, take 400 to 800 micrograms (mcg) of folic acid every day.  If you want to prevent pregnancy, talk to your health care provider about birth control (contraception). Osteoporosis and menopause  Osteoporosis is a disease in which the bones lose minerals and strength with aging. This can result in serious bone fractures. Your risk for osteoporosis can be identified using a bone density scan.  If you are 64 years of age or older, or if you are at risk for osteoporosis and fractures, ask your health care provider if you should be screened.  Ask your health care provider whether you  should take a calcium or vitamin D supplement to lower your risk for osteoporosis.  Menopause may have certain physical symptoms and risks.  Hormone replacement therapy may reduce some of these symptoms and risks. Talk to your health care provider about whether hormone replacement therapy is right for you. Follow these instructions at home:  Schedule regular health, dental, and eye exams.  Stay current with your immunizations.  Do not use any tobacco products including cigarettes, chewing tobacco, or electronic cigarettes.  If you are pregnant, do not drink alcohol.  If you are breastfeeding, limit how much and how often you drink alcohol.  Limit alcohol intake to no more than 1 drink per day for nonpregnant women. One drink equals 12 ounces of beer, 5 ounces of wine, or 1 ounces of hard liquor.  Do not use street drugs.  Do not share needles.  Ask your health care provider for help if you need support or information about quitting drugs.  Tell your health care provider if you often feel depressed.  Tell your health care provider if you have ever been abused or do not feel safe at home. This information is not intended to replace advice given to you by your health care provider. Make sure you discuss any questions you have with your health care provider. Document Released: 01/16/2011 Document Revised: 12/09/2015 Document Reviewed: 04/06/2015 Elsevier Interactive Patient Education  [employer].

## 2017-09-07 LAB — PAP IG W/ RFLX HPV ASCU

## 2017-10-16 DIAGNOSIS — I10 Essential (primary) hypertension: Secondary | ICD-10-CM | POA: Diagnosis not present

## 2017-10-16 DIAGNOSIS — R82998 Other abnormal findings in urine: Secondary | ICD-10-CM | POA: Diagnosis not present

## 2017-10-16 DIAGNOSIS — Z0000 Encounter for general adult medical examination without abnormal findings: Secondary | ICD-10-CM | POA: Diagnosis not present

## 2017-10-22 DIAGNOSIS — Z6831 Body mass index (BMI) 31.0-31.9, adult: Secondary | ICD-10-CM | POA: Diagnosis not present

## 2017-10-22 DIAGNOSIS — Z0000 Encounter for general adult medical examination without abnormal findings: Secondary | ICD-10-CM | POA: Diagnosis not present

## 2017-10-22 DIAGNOSIS — E669 Obesity, unspecified: Secondary | ICD-10-CM | POA: Diagnosis not present

## 2017-10-22 DIAGNOSIS — Z1389 Encounter for screening for other disorder: Secondary | ICD-10-CM | POA: Diagnosis not present

## 2017-10-22 DIAGNOSIS — I10 Essential (primary) hypertension: Secondary | ICD-10-CM | POA: Diagnosis not present

## 2018-05-01 DIAGNOSIS — I10 Essential (primary) hypertension: Secondary | ICD-10-CM | POA: Diagnosis not present

## 2018-05-01 DIAGNOSIS — E669 Obesity, unspecified: Secondary | ICD-10-CM | POA: Diagnosis not present

## 2018-05-01 DIAGNOSIS — Z6831 Body mass index (BMI) 31.0-31.9, adult: Secondary | ICD-10-CM | POA: Diagnosis not present

## 2018-09-09 ENCOUNTER — Encounter: Payer: BLUE CROSS/BLUE SHIELD | Admitting: Obstetrics & Gynecology

## 2018-09-25 ENCOUNTER — Encounter: Payer: BLUE CROSS/BLUE SHIELD | Admitting: Obstetrics & Gynecology

## 2018-10-09 ENCOUNTER — Encounter: Payer: BLUE CROSS/BLUE SHIELD | Admitting: Obstetrics & Gynecology

## 2018-10-21 ENCOUNTER — Other Ambulatory Visit: Payer: Self-pay | Admitting: Obstetrics & Gynecology

## 2018-10-22 NOTE — Telephone Encounter (Signed)
Annual scheduled on 12/25/18 

## 2018-11-05 DIAGNOSIS — E7849 Other hyperlipidemia: Secondary | ICD-10-CM | POA: Diagnosis not present

## 2018-11-05 DIAGNOSIS — R82998 Other abnormal findings in urine: Secondary | ICD-10-CM | POA: Diagnosis not present

## 2018-11-05 DIAGNOSIS — I10 Essential (primary) hypertension: Secondary | ICD-10-CM | POA: Diagnosis not present

## 2018-11-11 DIAGNOSIS — Z1331 Encounter for screening for depression: Secondary | ICD-10-CM | POA: Diagnosis not present

## 2018-11-11 DIAGNOSIS — E785 Hyperlipidemia, unspecified: Secondary | ICD-10-CM | POA: Diagnosis not present

## 2018-11-11 DIAGNOSIS — E669 Obesity, unspecified: Secondary | ICD-10-CM | POA: Diagnosis not present

## 2018-11-11 DIAGNOSIS — Z0000 Encounter for general adult medical examination without abnormal findings: Secondary | ICD-10-CM | POA: Diagnosis not present

## 2018-11-11 DIAGNOSIS — I10 Essential (primary) hypertension: Secondary | ICD-10-CM | POA: Diagnosis not present

## 2018-11-11 DIAGNOSIS — Z1339 Encounter for screening examination for other mental health and behavioral disorders: Secondary | ICD-10-CM | POA: Diagnosis not present

## 2018-12-24 ENCOUNTER — Other Ambulatory Visit: Payer: Self-pay

## 2018-12-25 ENCOUNTER — Other Ambulatory Visit: Payer: Self-pay

## 2018-12-25 ENCOUNTER — Encounter: Payer: Self-pay | Admitting: Obstetrics & Gynecology

## 2018-12-25 ENCOUNTER — Ambulatory Visit (INDEPENDENT_AMBULATORY_CARE_PROVIDER_SITE_OTHER): Payer: BC Managed Care – PPO | Admitting: Obstetrics & Gynecology

## 2018-12-25 VITALS — BP 136/84 | Ht 62.0 in | Wt 183.0 lb

## 2018-12-25 DIAGNOSIS — Z6833 Body mass index (BMI) 33.0-33.9, adult: Secondary | ICD-10-CM | POA: Diagnosis not present

## 2018-12-25 DIAGNOSIS — Z01419 Encounter for gynecological examination (general) (routine) without abnormal findings: Secondary | ICD-10-CM

## 2018-12-25 DIAGNOSIS — Z3041 Encounter for surveillance of contraceptive pills: Secondary | ICD-10-CM | POA: Diagnosis not present

## 2018-12-25 DIAGNOSIS — E6609 Other obesity due to excess calories: Secondary | ICD-10-CM | POA: Diagnosis not present

## 2018-12-25 MED ORDER — DESOGESTREL-ETHINYL ESTRADIOL 0.15-0.02/0.01 MG (21/5) PO TABS: 1.0000 | ORAL_TABLET | Freq: Every day | ORAL | 4 refills | Status: DC

## 2018-12-25 NOTE — Patient Instructions (Signed)
1. Well female exam with routine gynecological exam Normal gynecologic exam.  Pap test negative in February 2019, no indication to repeat this year.  Breast exam normal.  Patient is now 38 year old, will schedule a screening mammogram at the breast center.  Health labs with family physician.  2. Encounter for surveillance of contraceptive pills Well on continuous birth control pills with the generic of Mircette.  Controlling withdrawal migraines well with continuous birth control pills.  No contraindication to continue on birth control pills.  Prescription sent to pharmacy.  3. Class 1 obesity due to excess calories without serious comorbidity with body mass index (BMI) of 33.0 to 33.9 in adult Recommend a low calorie/carb diet such as [employer].  Intermittent fasting discussed.  Aerobic physical activities 5 times a week and weightlifting every 2 days recommended.  Other orders - desogestrel-ethinyl estradiol (MIRCETTE) 0.15-0.02/0.01 MG (21/5) tablet; Take 1 tablet by mouth daily. Will use continuously because of menstrual migraines  Dustina, it was a pleasure seeing you today!

## 2018-12-25 NOTE — Progress Notes (Signed)
[place] 1980/12/21 [id number]   History:    38 y.o. G1P1L1 married.  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  Son is 63 yo, just finished 5th grade.  RP:  Established patient presenting for annual gyn exam   HPI: Well on Mircette generic continuously for withdrawal migraines.  Light menses about every 4 months.  Controlling migraines.  No pelvic pain.  No pain with IC.  Urine/BMs normal.  Breasts normal.  BMI 33.47.  Started a fitness program.  Eating more vegetables and fruits.  Health labs with Fam MD.  Past medical history,surgical history, family history and social history were all reviewed and documented in the EPIC chart.  Gynecologic History No LMP recorded. (Menstrual status: Oral contraceptives). Contraception: OCP (estrogen/progesterone) Last Pap: 08/2017. Results were: Negative Last mammogram: [facility]. Bone Density: Never Colonoscopy: Never  Obstetric History OB History  Gravida Para Term Preterm AB Living  1       0 1  SAB TAB Ectopic Multiple Live Births      0        # Outcome Date GA Lbr Len/2nd Weight Sex Delivery Anes PTL Lv  1 Gravida              ROS: A ROS was performed and pertinent positives and negatives are included in the history.  GENERAL: No fevers or chills. HEENT: No change in vision, no earache, sore throat or sinus congestion. NECK: No pain or stiffness. CARDIOVASCULAR: No chest pain or pressure. No palpitations. PULMONARY: No shortness of breath, cough or wheeze. GASTROINTESTINAL: No abdominal pain, nausea, vomiting or diarrhea, melena or bright red blood per rectum. GENITOURINARY: No urinary frequency, urgency, hesitancy or dysuria. MUSCULOSKELETAL: No joint or muscle pain, no back pain, no recent trauma. DERMATOLOGIC: No rash, no itching, no lesions. ENDOCRINE: No polyuria, polydipsia, no heat or cold intolerance. No recent change in weight. HEMATOLOGICAL: No anemia or easy bruising or bleeding. NEUROLOGIC: No headache,  seizures, numbness, tingling or weakness. PSYCHIATRIC: No depression, no loss of interest in normal activity or change in sleep pattern.     Exam:   BP 136/84   Ht 5\' 2"  (1.575 m)   Wt 183 lb (83 kg)   BMI 33.47 kg/m   Body mass index is 33.47 kg/m.  General appearance : Well developed well nourished female. No acute distress HEENT: Eyes: no retinal hemorrhage or exudates,  Neck supple, trachea midline, no carotid bruits, no thyroidmegaly Lungs: Clear to auscultation, no rhonchi or wheezes, or rib retractions  Heart: Regular rate and rhythm, no murmurs or gallops Breast:Examined in sitting and supine position were symmetrical in appearance, no palpable masses or tenderness,  no skin retraction, no nipple inversion, no nipple discharge, no skin discoloration, no axillary or supraclavicular lymphadenopathy Abdomen: no palpable masses or tenderness, no rebound or guarding Extremities: no edema or skin discoloration or tenderness  Pelvic: Vulva: Normal             Vagina: No gross lesions or discharge  Cervix: No gross lesions or discharge  Uterus  AV, normal size, shape and consistency, non-tender and mobile  Adnexa  Without masses or tenderness  Anus: Normal   Assessment/Plan:  38 y.o. female for annual exam   1. Well female exam with routine gynecological exam Normal gynecologic exam.  Pap test negative in February 2019, no indication to repeat this year.  Breast exam normal.  Patient is now 38 year old, will schedule a screening mammogram at the  breast center.  Health labs with family physician.  2. Encounter for surveillance of contraceptive pills Well on continuous birth control pills with the generic of Mircette.  Controlling withdrawal migraines well with continuous birth control pills.  No contraindication to continue on birth control pills.  Prescription sent to pharmacy.  3. Class 1 obesity due to excess calories without serious comorbidity with body mass index (BMI) of  33.0 to 33.9 in adult Recommend a low calorie/carb diet such as [employer] [employer] diet.  Intermittent fasting discussed.  Aerobic physical activities 5 times a week and weightlifting every 2 days recommended.  Other orders - desogestrel-ethinyl estradiol (MIRCETTE) 0.15-0.02/0.01 MG (21/5) tablet; Take 1 tablet by mouth daily. Will use continuously because of menstrual migraines  Genia DelMarie-Lyne Haidynn Almendarez MD, 3:57 PM 12/25/2018

## 2019-05-14 DIAGNOSIS — I10 Essential (primary) hypertension: Secondary | ICD-10-CM | POA: Diagnosis not present

## 2019-05-14 DIAGNOSIS — E669 Obesity, unspecified: Secondary | ICD-10-CM | POA: Diagnosis not present

## 2019-05-14 DIAGNOSIS — E785 Hyperlipidemia, unspecified: Secondary | ICD-10-CM | POA: Diagnosis not present

## 2019-12-29 ENCOUNTER — Encounter: Payer: Self-pay | Admitting: Obstetrics & Gynecology

## 2019-12-29 ENCOUNTER — Ambulatory Visit (INDEPENDENT_AMBULATORY_CARE_PROVIDER_SITE_OTHER): Payer: BC Managed Care – PPO | Admitting: Obstetrics & Gynecology

## 2019-12-29 ENCOUNTER — Other Ambulatory Visit: Payer: Self-pay

## 2019-12-29 ENCOUNTER — Other Ambulatory Visit: Payer: Self-pay | Admitting: Obstetrics & Gynecology

## 2019-12-29 VITALS — BP 132/86 | Ht 62.0 in | Wt 185.6 lb

## 2019-12-29 DIAGNOSIS — Z01419 Encounter for gynecological examination (general) (routine) without abnormal findings: Secondary | ICD-10-CM

## 2019-12-29 DIAGNOSIS — Z6833 Body mass index (BMI) 33.0-33.9, adult: Secondary | ICD-10-CM | POA: Diagnosis not present

## 2019-12-29 DIAGNOSIS — Z3041 Encounter for surveillance of contraceptive pills: Secondary | ICD-10-CM

## 2019-12-29 DIAGNOSIS — E6609 Other obesity due to excess calories: Secondary | ICD-10-CM | POA: Diagnosis not present

## 2019-12-29 NOTE — Progress Notes (Signed)
[place] Dec 07, 1980 [id number]   History:    39 y.o. G1P1L1 married.  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  Son is 23 yo, just finished 6th grade.  RP:  Established patient presenting for annual gyn exam   HPI: Well on Mircette generic continuously for withdrawal migraines.  Light menses about every 4 months.  Controlling migraines.  No pelvic pain.  No pain with IC.  Urine/BMs normal.  Breasts normal.  BMI 33.95.  Planning lower carbs and walking every day.  Health labs with Fam MD.   Past medical history,surgical history, family history and social history were all reviewed and documented in the EPIC chart.  Gynecologic History No LMP recorded. (Menstrual status: Oral contraceptives).  Obstetric History OB History  Gravida Para Term Preterm AB Living  1       0 1  SAB TAB Ectopic Multiple Live Births      0        # Outcome Date GA Lbr Len/2nd Weight Sex Delivery Anes PTL Lv  1 Gravida              ROS: A ROS was performed and pertinent positives and negatives are included in the history.  GENERAL: No fevers or chills. HEENT: No change in vision, no earache, sore throat or sinus congestion. NECK: No pain or stiffness. CARDIOVASCULAR: No chest pain or pressure. No palpitations. PULMONARY: No shortness of breath, cough or wheeze. GASTROINTESTINAL: No abdominal pain, nausea, vomiting or diarrhea, melena or bright red blood per rectum. GENITOURINARY: No urinary frequency, urgency, hesitancy or dysuria. MUSCULOSKELETAL: No joint or muscle pain, no back pain, no recent trauma. DERMATOLOGIC: No rash, no itching, no lesions. ENDOCRINE: No polyuria, polydipsia, no heat or cold intolerance. No recent change in weight. HEMATOLOGICAL: No anemia or easy bruising or bleeding. NEUROLOGIC: No headache, seizures, numbness, tingling or weakness. PSYCHIATRIC: No depression, no loss of interest in normal activity or change in sleep pattern.     Exam:   BP 132/86   Ht 5\' 2"  (1.575 m)   Wt 185 lb 9.6 oz  (84.2 kg)   BMI 33.95 kg/m   Body mass index is 33.95 kg/m.  General appearance : Well developed well nourished female. No acute distress HEENT: Eyes: no retinal hemorrhage or exudates,  Neck supple, trachea midline, no carotid bruits, no thyroidmegaly Lungs: Clear to auscultation, no rhonchi or wheezes, or rib retractions  Heart: Regular rate and rhythm, no murmurs or gallops Breast:Examined in sitting and supine position were symmetrical in appearance, no palpable masses or tenderness,  no skin retraction, no nipple inversion, no nipple discharge, no skin discoloration, no axillary or supraclavicular lymphadenopathy Abdomen: no palpable masses or tenderness, no rebound or guarding Extremities: no edema or skin discoloration or tenderness  Pelvic: Vulva: Normal             Vagina: No gross lesions or discharge  Cervix: No gross lesions or discharge  Uterus  AV, normal size, shape and consistency, non-tender and mobile  Adnexa  Without masses or tenderness  Anus: Normal   Assessment/Plan:  39 y.o. female for annual exam   1. Well female exam with routine gynecological exam Normal gynecologic exam.  Pap test normal in 2019, will repeat a Pap test at 3 years next year.  Breast exam normal.  Will schedule her first screening mammogram at the breast center now.  Health labs with family physician.  2. Encounter for surveillance of contraceptive pills Well on the generic of Mircette.  No CI to continue on BCPs.  Mircette generic represcribed.  3. Class 1 obesity due to excess calories without serious comorbidity with body mass index (BMI) of 33.0 to 33.9 in adult Recommend a lower calorie/carb diet such as [employer].  Decrease portions.  Aerobic activities in the form of walking every day recommended.  Light weightlifting every 2 days also recommended.  [name] MD, 2:55 PM 12/29/2019

## 2019-12-29 NOTE — Patient Instructions (Signed)
1. Well female exam with routine gynecological exam Normal gynecologic exam.  Pap test normal in 2019, will repeat a Pap test at 3 years next year.  Breast exam normal.  Will schedule her first screening mammogram at the breast center now.  Health labs with family physician.  2. Encounter for surveillance of contraceptive pills Well on the generic of Mircette.  No CI to continue on BCPs.  Mircette generic represcribed.  3. Class 1 obesity due to excess calories without serious comorbidity with body mass index (BMI) of 33.0 to 33.9 in adult Recommend a lower calorie/carb diet such as [employer].  Decrease portions.  Aerobic activities in the form of walking every day recommended.  Light weightlifting every 2 days also recommended.  Sharon Russo, it was a pleasure seeing you today!

## 2020-01-07 DIAGNOSIS — Z0000 Encounter for general adult medical examination without abnormal findings: Secondary | ICD-10-CM | POA: Diagnosis not present

## 2020-01-07 DIAGNOSIS — E7849 Other hyperlipidemia: Secondary | ICD-10-CM | POA: Diagnosis not present

## 2020-01-14 DIAGNOSIS — Z1331 Encounter for screening for depression: Secondary | ICD-10-CM | POA: Diagnosis not present

## 2020-01-14 DIAGNOSIS — Z0000 Encounter for general adult medical examination without abnormal findings: Secondary | ICD-10-CM | POA: Diagnosis not present

## 2020-01-14 DIAGNOSIS — E785 Hyperlipidemia, unspecified: Secondary | ICD-10-CM | POA: Diagnosis not present

## 2020-01-14 DIAGNOSIS — G43909 Migraine, unspecified, not intractable, without status migrainosus: Secondary | ICD-10-CM | POA: Diagnosis not present

## 2020-01-14 DIAGNOSIS — I10 Essential (primary) hypertension: Secondary | ICD-10-CM | POA: Diagnosis not present

## 2020-01-14 DIAGNOSIS — R82998 Other abnormal findings in urine: Secondary | ICD-10-CM | POA: Diagnosis not present

## 2020-01-14 DIAGNOSIS — E669 Obesity, unspecified: Secondary | ICD-10-CM | POA: Diagnosis not present

## 2020-01-27 ENCOUNTER — Other Ambulatory Visit: Payer: Self-pay | Admitting: Obstetrics & Gynecology

## 2020-01-27 DIAGNOSIS — Z1231 Encounter for screening mammogram for malignant neoplasm of breast: Secondary | ICD-10-CM

## 2020-02-03 ENCOUNTER — Other Ambulatory Visit: Payer: Self-pay

## 2020-02-03 ENCOUNTER — Ambulatory Visit
Admission: RE | Admit: 2020-02-03 | Discharge: 2020-02-03 | Disposition: A | Discharge status: Home / Self Care | Payer: BC Managed Care – PPO | Source: Ambulatory Visit | Attending: Obstetrics & Gynecology | Admitting: Obstetrics & Gynecology

## 2020-02-03 DIAGNOSIS — Z1231 Encounter for screening mammogram for malignant neoplasm of breast: Secondary | ICD-10-CM

## 2020-05-15 DIAGNOSIS — Z23 Encounter for immunization: Secondary | ICD-10-CM | POA: Diagnosis not present

## 2020-06-24 DIAGNOSIS — Z23 Encounter for immunization: Secondary | ICD-10-CM | POA: Diagnosis not present

## 2020-06-29 DIAGNOSIS — H04123 Dry eye syndrome of bilateral lacrimal glands: Secondary | ICD-10-CM | POA: Diagnosis not present

## 2020-08-04 DIAGNOSIS — I10 Essential (primary) hypertension: Secondary | ICD-10-CM | POA: Diagnosis not present

## 2020-08-04 DIAGNOSIS — E785 Hyperlipidemia, unspecified: Secondary | ICD-10-CM | POA: Diagnosis not present

## 2020-12-29 ENCOUNTER — Other Ambulatory Visit: Payer: Self-pay | Admitting: Obstetrics & Gynecology

## 2020-12-29 DIAGNOSIS — Z1231 Encounter for screening mammogram for malignant neoplasm of breast: Secondary | ICD-10-CM

## 2021-01-04 ENCOUNTER — Ambulatory Visit: Payer: BC Managed Care – PPO | Admitting: Obstetrics & Gynecology

## 2021-01-26 DIAGNOSIS — E785 Hyperlipidemia, unspecified: Secondary | ICD-10-CM | POA: Diagnosis not present

## 2021-02-02 DIAGNOSIS — R8290 Unspecified abnormal findings in urine: Secondary | ICD-10-CM | POA: Diagnosis not present

## 2021-02-02 DIAGNOSIS — I10 Essential (primary) hypertension: Secondary | ICD-10-CM | POA: Diagnosis not present

## 2021-02-02 DIAGNOSIS — Z0000 Encounter for general adult medical examination without abnormal findings: Secondary | ICD-10-CM | POA: Diagnosis not present

## 2021-02-02 DIAGNOSIS — R82998 Other abnormal findings in urine: Secondary | ICD-10-CM | POA: Diagnosis not present

## 2021-02-07 ENCOUNTER — Other Ambulatory Visit: Payer: Self-pay

## 2021-02-07 ENCOUNTER — Encounter: Payer: Self-pay | Admitting: Obstetrics & Gynecology

## 2021-02-07 ENCOUNTER — Other Ambulatory Visit (HOSPITAL_COMMUNITY)
Admission: RE | Admit: 2021-02-07 | Discharge: 2021-02-07 | Disposition: A | Discharge status: Home / Self Care | Payer: BC Managed Care – PPO | Source: Ambulatory Visit | Attending: Obstetrics & Gynecology | Admitting: Obstetrics & Gynecology

## 2021-02-07 ENCOUNTER — Ambulatory Visit (INDEPENDENT_AMBULATORY_CARE_PROVIDER_SITE_OTHER): Payer: BC Managed Care – PPO | Admitting: Obstetrics & Gynecology

## 2021-02-07 VITALS — BP 118/70 | HR 64 | Ht 62.0 in | Wt 183.0 lb

## 2021-02-07 DIAGNOSIS — Z6833 Body mass index (BMI) 33.0-33.9, adult: Secondary | ICD-10-CM

## 2021-02-07 DIAGNOSIS — Z01419 Encounter for gynecological examination (general) (routine) without abnormal findings: Secondary | ICD-10-CM | POA: Insufficient documentation

## 2021-02-07 DIAGNOSIS — Z3041 Encounter for surveillance of contraceptive pills: Secondary | ICD-10-CM

## 2021-02-07 DIAGNOSIS — E6609 Other obesity due to excess calories: Secondary | ICD-10-CM | POA: Diagnosis not present

## 2021-02-07 MED ORDER — DESOGESTREL-ETHINYL ESTRADIOL 0.15-0.02/0.01 MG (21/5) PO TABS: 1.0000 | ORAL_TABLET | Freq: Every day | ORAL | 4 refills | Status: DC

## 2021-02-07 NOTE — Progress Notes (Signed)
Sharon Russo October 03, 1980 [id number]   History:    40 y.o. G1P1L1 married.  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   RP:  Established patient presenting for annual gyn exam   HPI: Well on Mircette generic continuously for withdrawal migraines.  Light menses about every 4 months.  Controlling migraines.  No pelvic pain.  No pain with IC.  Urine/BMs normal.  Breasts normal.  BMI 33.47.  Lower carbs and walking every day.  Health labs with Fam MD.   Past medical history,surgical history, family history and social history were all reviewed and documented in the EPIC chart.  Gynecologic History No LMP recorded. (Menstrual status: Oral contraceptives).  Obstetric History OB History  Gravida Para Term Preterm AB Living  1       0 1  SAB IAB Ectopic Multiple Live Births      0        # Outcome Date GA Lbr Len/2nd Weight Sex Delivery Anes PTL Lv  1 Gravida              ROS: A ROS was performed and pertinent positives and negatives are included in the history.  GENERAL: No fevers or chills. HEENT: No change in vision, no earache, sore throat or sinus congestion. NECK: No pain or stiffness. CARDIOVASCULAR: No chest pain or pressure. No palpitations. PULMONARY: No shortness of breath, cough or wheeze. GASTROINTESTINAL: No abdominal pain, nausea, vomiting or diarrhea, melena or bright red blood per rectum. GENITOURINARY: No urinary frequency, urgency, hesitancy or dysuria. MUSCULOSKELETAL: No joint or muscle pain, no back pain, no recent trauma. DERMATOLOGIC: No rash, no itching, no lesions. ENDOCRINE: No polyuria, polydipsia, no heat or cold intolerance. No recent change in weight. HEMATOLOGICAL: No anemia or easy bruising or bleeding. NEUROLOGIC: No headache, seizures, numbness, tingling or weakness. PSYCHIATRIC: No depression, no loss of interest in normal activity or change in sleep pattern.     Exam:   BP 118/70   Pulse 64   Ht 5\' 2"  (1.575 m)   Wt 183 lb (83 kg)    BMI 33.47 kg/m   Body mass index is 33.47 kg/m.  General appearance : Well developed well nourished female. No acute distress HEENT: Eyes: no retinal hemorrhage or exudates,  Neck supple, trachea midline, no carotid bruits, no thyroidmegaly Lungs: Clear to auscultation, no rhonchi or wheezes, or rib retractions  Heart: Regular rate and rhythm, no murmurs or gallops Breast:Examined in sitting and supine position were symmetrical in appearance, no palpable masses or tenderness,  no skin retraction, no nipple inversion, no nipple discharge, no skin discoloration, no axillary or supraclavicular lymphadenopathy Abdomen: no palpable masses or tenderness, no rebound or guarding Extremities: no edema or skin discoloration or tenderness  Pelvic: Vulva: Normal             Vagina: No gross lesions or discharge  Cervix: No gross lesions or discharge.  Pap reflex done.  Uterus  AV, normal size, shape and consistency, non-tender and mobile  Adnexa  Without masses or tenderness  Anus: Normal   Assessment/Plan:  40 y.o. female for annual exam   1. Encounter for routine gynecological examination with Papanicolaou smear of cervix Normal gynecologic exam.  Pap reflex done.  Breast exam normal.  We will continue screening mammogram annually. - Cytology - PAP( [facility])  2. Encounter for surveillance of contraceptive pills Well on Kariva continuously for migraines.  No contraindication to continue.  Prescription sent  to pharmacy.  3. Class 1 obesity due to excess calories without serious comorbidity with body mass index (BMI) of 33.0 to 33.9 in adult Recommend a lower calorie/carb diet.  Aerobic activities 5 times a week and light weightlifting every 2 days.  Other orders - desogestrel-ethinyl estradiol (KARIVA) 0.15-0.02/0.01 MG (21/5) tablet; Take 1 tablet by mouth daily.   [name] MD, 4:20 PM 02/07/2021

## 2021-02-08 LAB — CYTOLOGY - PAP: Diagnosis: NEGATIVE

## 2021-02-13 ENCOUNTER — Encounter: Payer: Self-pay | Admitting: Obstetrics & Gynecology

## 2021-02-22 ENCOUNTER — Other Ambulatory Visit: Payer: Self-pay

## 2021-02-22 ENCOUNTER — Ambulatory Visit
Admission: RE | Admit: 2021-02-22 | Discharge: 2021-02-22 | Disposition: A | Discharge status: Home / Self Care | Payer: BC Managed Care – PPO | Source: Ambulatory Visit | Attending: Obstetrics & Gynecology | Admitting: Obstetrics & Gynecology

## 2021-02-22 DIAGNOSIS — Z1231 Encounter for screening mammogram for malignant neoplasm of breast: Secondary | ICD-10-CM

## 2021-07-13 DIAGNOSIS — H5202 Hypermetropia, left eye: Secondary | ICD-10-CM | POA: Diagnosis not present

## 2021-07-13 DIAGNOSIS — H524 Presbyopia: Secondary | ICD-10-CM | POA: Diagnosis not present

## 2021-08-08 DIAGNOSIS — I10 Essential (primary) hypertension: Secondary | ICD-10-CM | POA: Diagnosis not present

## 2021-08-08 DIAGNOSIS — Z23 Encounter for immunization: Secondary | ICD-10-CM | POA: Diagnosis not present

## 2022-01-10 ENCOUNTER — Other Ambulatory Visit: Payer: Self-pay | Admitting: Obstetrics & Gynecology

## 2022-01-10 DIAGNOSIS — Z1231 Encounter for screening mammogram for malignant neoplasm of breast: Secondary | ICD-10-CM

## 2022-01-23 ENCOUNTER — Ambulatory Visit (INDEPENDENT_AMBULATORY_CARE_PROVIDER_SITE_OTHER): Payer: 59 | Admitting: Obstetrics & Gynecology

## 2022-01-23 ENCOUNTER — Encounter: Payer: Self-pay | Admitting: Obstetrics & Gynecology

## 2022-01-23 VITALS — BP 108/68 | HR 80 | Ht 62.5 in | Wt 183.0 lb

## 2022-01-23 DIAGNOSIS — Z01419 Encounter for gynecological examination (general) (routine) without abnormal findings: Secondary | ICD-10-CM | POA: Diagnosis not present

## 2022-01-23 DIAGNOSIS — N921 Excessive and frequent menstruation with irregular cycle: Secondary | ICD-10-CM | POA: Diagnosis not present

## 2022-01-23 MED ORDER — NORETHIN ACE-ETH ESTRAD-FE 1-20 MG-MCG(24) PO TABS
1.0000 | ORAL_TABLET | Freq: Every day | ORAL | 4 refills | Status: DC
Start: 2022-01-23 — End: 2023-01-25

## 2022-01-23 NOTE — Progress Notes (Signed)
Sharon Russo July 15, 1981 [id number]   History:    41 y.o. G1P1L1 married.  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   RP:  Established patient presenting for annual gyn exam   HPI: Well on Mircette generic continuously for withdrawal migraines, still controlling migraines, but now having frequent BTB with cramping x 1 month.  No pelvic pain.  No pain with IC.  Pap Neg 01/2021.  No h/o abnormal Pap.  Will repeat a Pap at 2-3 yrs. Urine/BMs normal.  Breasts normal.  Mammo Neg 02/2021.  BMI improved to 32.94.  Lower carbs and walking every day.  Health labs with Fam MD.   Past medical history,surgical history, family history and social history were all reviewed and documented in the EPIC chart.  Gynecologic History No LMP recorded. (Menstrual status: Oral contraceptives).  Obstetric History OB History  Gravida Para Term Preterm AB Living  1       0 1  SAB IAB Ectopic Multiple Live Births      0        # Outcome Date GA Lbr Len/2nd Weight Sex Delivery Anes PTL Lv  1 Gravida              ROS: A ROS was performed and pertinent positives and negatives are included in the history. GENERAL: No fevers or chills. HEENT: No change in vision, no earache, sore throat or sinus congestion. NECK: No pain or stiffness. CARDIOVASCULAR: No chest pain or pressure. No palpitations. PULMONARY: No shortness of breath, cough or wheeze. GASTROINTESTINAL: No abdominal pain, nausea, vomiting or diarrhea, melena or bright red blood per rectum. GENITOURINARY: No urinary frequency, urgency, hesitancy or dysuria. MUSCULOSKELETAL: No joint or muscle pain, no back pain, no recent trauma. DERMATOLOGIC: No rash, no itching, no lesions. ENDOCRINE: No polyuria, polydipsia, no heat or cold intolerance. No recent change in weight. HEMATOLOGICAL: No anemia or easy bruising or bleeding. NEUROLOGIC: No headache, seizures, numbness, tingling or weakness. PSYCHIATRIC: No depression, no loss of interest in  normal activity or change in sleep pattern.     Exam:   BP 108/68   Pulse 80   Ht 5' 2.5" (1.588 m)   Wt 183 lb (83 kg)   SpO2 95%   BMI 32.94 kg/m   Body mass index is 32.94 kg/m.  General appearance : Well developed well nourished female. No acute distress HEENT: Eyes: no retinal hemorrhage or exudates,  Neck supple, trachea midline, no carotid bruits, no thyroidmegaly Lungs: Clear to auscultation, no rhonchi or wheezes, or rib retractions  Heart: Regular rate and rhythm, no murmurs or gallops Breast:Examined in sitting and supine position were symmetrical in appearance, no palpable masses or tenderness,  no skin retraction, no nipple inversion, no nipple discharge, no skin discoloration, no axillary or supraclavicular lymphadenopathy Abdomen: no palpable masses or tenderness, no rebound or guarding Extremities: no edema or skin discoloration or tenderness  Pelvic: Vulva: Normal             Vagina: No gross lesions or discharge  Cervix: No gross lesions or discharge  Uterus  AV, normal size, shape and consistency, non-tender and mobile  Adnexa  Without masses or tenderness  Anus: Normal   Assessment/Plan:  41 y.o. female for annual exam   1. Well female exam with routine gynecological exam Well on Mircette generic continuously for withdrawal migraines, still controlling migraines, but now having frequent BTB with cramping x 1 month.  No  pelvic pain.  No pain with IC.  Pap Neg 01/2021.  No h/o abnormal Pap.  Will repeat a Pap at 2-3 yrs. Urine/BMs normal.  Breasts normal.  Mammo Neg 02/2021.  BMI improved to 32.94.  Lower carbs and walking every day.  Health labs with Fam MD.  2. Breakthrough bleeding on birth control pills Decision to change BCPs to the generic of LoEstrin 24 Fe 1/20 given the recent BTB on Mircette.  Will use the new BCPs continuously as well for Migraines.  F/U Pelvic US to further investigate the BTB. - US Transvaginal Non-OB; Future  Other orders -  UBRELVY 50 MG TABS; Take by mouth. - Norethindrone Acetate-Ethinyl Estrad-FE (LOESTRIN 24 FE) 1-20 MG-MCG(24) tablet; Take 1 tablet by mouth daily. Continuous use for Migraines   [name] MD, 11:34 AM 01/23/2022

## 2022-02-08 ENCOUNTER — Ambulatory Visit: Payer: BC Managed Care – PPO | Admitting: Obstetrics & Gynecology

## 2022-02-24 ENCOUNTER — Ambulatory Visit
Admission: RE | Admit: 2022-02-24 | Discharge: 2022-02-24 | Disposition: A | Discharge status: Home / Self Care | Payer: 59 | Source: Ambulatory Visit | Attending: Obstetrics & Gynecology | Admitting: Obstetrics & Gynecology

## 2022-02-24 DIAGNOSIS — Z1231 Encounter for screening mammogram for malignant neoplasm of breast: Secondary | ICD-10-CM

## 2022-08-24 ENCOUNTER — Encounter: Payer: Self-pay | Admitting: Internal Medicine

## 2022-08-25 ENCOUNTER — Telehealth: Payer: Self-pay | Admitting: *Deleted

## 2022-08-25 NOTE — Telephone Encounter (Signed)
PUS ordered 01/1022 for BTB on OCP.   Spoke with patient.  Patient states she has experienced other health issues since her last AEX, has seen rheumatology and has an appt with GI coming up. Declines to schedule PUS at this time. Would like to discuss changing back to [facility]. Recommended OV for further evaluation and discussion with Dr. Dellis Filbert. Patient declined to schedule at this time, will consider and return call next week.   Next AEX 01/25/23.  PUS order cancelled.   Routing to provider for final review. Patient is agreeable to disposition. Will close encounter.

## 2022-10-11 ENCOUNTER — Ambulatory Visit (INDEPENDENT_AMBULATORY_CARE_PROVIDER_SITE_OTHER): Payer: 59 | Admitting: Internal Medicine

## 2022-10-11 ENCOUNTER — Encounter: Payer: Self-pay | Admitting: Internal Medicine

## 2022-10-11 VITALS — BP 126/76 | HR 96 | Ht 62.0 in | Wt 168.0 lb

## 2022-10-11 DIAGNOSIS — R634 Abnormal weight loss: Secondary | ICD-10-CM | POA: Diagnosis not present

## 2022-10-11 DIAGNOSIS — K625 Hemorrhage of anus and rectum: Secondary | ICD-10-CM

## 2022-10-11 DIAGNOSIS — R112 Nausea with vomiting, unspecified: Secondary | ICD-10-CM

## 2022-10-11 DIAGNOSIS — R109 Unspecified abdominal pain: Secondary | ICD-10-CM | POA: Diagnosis not present

## 2022-10-11 DIAGNOSIS — R198 Other specified symptoms and signs involving the digestive system and abdomen: Secondary | ICD-10-CM

## 2022-10-11 MED ORDER — NA SULFATE-K SULFATE-MG SULF 17.5-3.13-1.6 GM/177ML PO SOLN: 1.0000 | Freq: Once | ORAL | 0 refills | Status: AC

## 2022-10-11 NOTE — Patient Instructions (Signed)
You have been scheduled for a colonoscopy and EGD. Please follow written instructions given to you at your visit today.  Please pick up your prep supplies at the pharmacy within the next 1-3 days. If you use inhalers (even only as needed), please bring them with you on the day of your procedure.   _______________________________________________________  If your blood pressure at your visit was 140/90 or greater, please contact your primary care physician to follow up on this.  _______________________________________________________  If you are age 8 or older, your body mass index should be between 23-30. Your Body mass index is 30.73 kg/m. If this is out of the aforementioned range listed, please consider follow up with your Primary Care Provider.  If you are age 4 or younger, your body mass index should be between 19-25. Your Body mass index is 30.73 kg/m. If this is out of the aformentioned range listed, please consider follow up with your Primary Care Provider.   ________________________________________________________  The [place] [facility] providers would like to encourage you to use [facility] to communicate with providers for non-urgent requests or questions.  Due to long hold times on the telephone, sending your provider a message by [facility] may be a faster and more efficient way to get a response.  Please allow 48 business hours for a response.  Please remember that this is for non-urgent requests.  _______________________________________________________ It was a pleasure to see you today!  Thank you for trusting me with your gastrointestinal care!

## 2022-10-11 NOTE — Progress Notes (Signed)
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 Reynaldo Minium regarding recent problems with persistent loose stools, abdominal pain, and rectal bleeding.  She is accompanied today by her husband, Homer (who is a paramedic).  I have seen the patient remotely for abdominal complaints.  She underwent colonoscopy July 2008 to evaluate right-sided pain and questionable thickening of the right colon on CT scan.  The examination, including deep intubation of the terminal ileum, was normal.  Patient has done well for years without GI complaints and has not been seen back since that time.  Patient states that around August 2023 she began to develop problems with joint pains and mouth ulcers.  She was evaluated by rheumatologist.  She has been treated with meloxicam.  These problems continued until December when she noticed a change in bowel habits.  Previously 1 formed bowel movement daily.  Since that time, describes 3 or 4 mostly loose stools and gas with the frequent appearance of blood.  There is also lower abdominal discomfort which seems to ease off after defecation.  She has had ongoing problems with mouth ulcers and joint pains.  It makes it difficult to eat.  She is on Magic mouthwash.  There has been infrequent, but definite, nausea and vomiting.  She is lost 25 pounds.  Mother has a history of ulcerative colitis and required partial colectomy.  Review of outside blood work from December 2023 shows low vitamin D level of 17.7.  She was strep positive.  Normal B12.  In July, prior to becoming ill, normal comprehensive metabolic panel and normal CBC with hemoglobin 14.4.  TSH normal  REVIEW OF SYSTEMS:  All non-GI ROS negative unless otherwise stated in HPI except for aches, muscle cramps, fatigue, ankle swelling  Past Medical History:  Diagnosis Date   Hyperlipidemia    Hypertension    Migraines    Obesity     Vitamin D deficiency     Past Surgical History:  Procedure Laterality Date   APPENDECTOMY      Social History Chauntae Bulley Paulhus  reports that she has never smoked. She has never used smokeless tobacco. She reports that she does not drink alcohol and does not use drugs.  family history includes Atrial fibrillation in her mother; Breast cancer (age of onset: 46) in her mother; CAD in her father; Dementia in her mother; Depression in her brother; Diabetes in her father and paternal grandmother; Hypertension in her maternal grandmother and mother; Kidney cancer in her mother.  No Known Allergies     PHYSICAL EXAMINATION: Vital signs: BP 126/76   Pulse 96   Ht 5\' 2"  (1.575 m)   Wt 168 lb (76.2 kg)   BMI 30.73 kg/m   Constitutional: generally well-appearing, no acute distress Psychiatric: alert and oriented x3, cooperative Eyes: extraocular movements intact, anicteric, conjunctiva pink Mouth: oral pharynx moist, no lesions Neck: supple no lymphadenopathy Cardiovascular: heart regular rate and rhythm, no murmur Lungs: clear to auscultation bilaterally Abdomen: soft, nontender, nondistended, no obvious ascites, no peritoneal signs, normal bowel sounds, no organomegaly Rectal: Deferred until colonoscopy Extremities: no clubbing, cyanosis, or lower extremity edema bilaterally Skin: no lesions on visible extremities Neuro: No focal deficits.  Cranial nerves intact  ASSESSMENT:  1.  93-month history of abdominal pain associated with loose stools and rectal bleeding.  Also, intermittent nausea and vomiting. 2.  Weight loss 3.  Family history  ulcerative colitis 4.  Problems with joint aches and mouth ulcers.  Question extraintestinal manifestations of IBD.  Question primary rheumatologic disorder   PLAN:  1.  Schedule colonoscopy to evaluate abdominal pain, change in bowel habits, rectal bleeding and weight loss.The nature of the procedure, as well as the risks, benefits, and  alternatives were carefully and thoroughly reviewed with the patient. Ample time for discussion and questions allowed. The patient understood, was satisfied, and agreed to proceed. 2.  Schedule upper endoscopy to evaluate intermittent nausea and vomiting, abdominal pain, weight loss.The nature of the procedure, as well as the risks, benefits, and alternatives were carefully and thoroughly reviewed with the patient. Ample time for discussion and questions allowed. The patient understood, was satisfied, and agreed to proceed. 3.  Further recommendations after the above plan A total of 60 minutes was spent preparing to see the patient, reviewing outside data, obtaining comprehensive history, performing medically appropriate physical examination, counseling and educating the patient and her husband regarding the above issues, ordering multiple endoscopic procedures, and documenting clinical information in the health record.

## 2022-10-19 ENCOUNTER — Telehealth: Payer: Self-pay | Admitting: Internal Medicine

## 2022-10-19 NOTE — Telephone Encounter (Signed)
Patient is calling states her insurance is saying she still need PA for upcoming procedure 4/8. Please advise

## 2022-10-23 ENCOUNTER — Encounter: Payer: Self-pay | Admitting: Internal Medicine

## 2022-10-23 ENCOUNTER — Ambulatory Visit: Payer: Managed Care, Other (non HMO) | Admitting: Internal Medicine

## 2022-10-23 VITALS — BP 107/75 | HR 83 | Temp 98.4°F | Resp 27 | Ht 62.0 in | Wt 168.0 lb

## 2022-10-23 DIAGNOSIS — R109 Unspecified abdominal pain: Secondary | ICD-10-CM

## 2022-10-23 DIAGNOSIS — R112 Nausea with vomiting, unspecified: Secondary | ICD-10-CM | POA: Diagnosis not present

## 2022-10-23 DIAGNOSIS — D120 Benign neoplasm of cecum: Secondary | ICD-10-CM

## 2022-10-23 DIAGNOSIS — K633 Ulcer of intestine: Secondary | ICD-10-CM

## 2022-10-23 DIAGNOSIS — R634 Abnormal weight loss: Secondary | ICD-10-CM

## 2022-10-23 DIAGNOSIS — K625 Hemorrhage of anus and rectum: Secondary | ICD-10-CM

## 2022-10-23 DIAGNOSIS — R198 Other specified symptoms and signs involving the digestive system and abdomen: Secondary | ICD-10-CM

## 2022-10-23 MED ORDER — PREDNISONE 10 MG PO TABS: ORAL_TABLET | ORAL | 2 refills | Status: DC

## 2022-10-23 MED ORDER — SODIUM CHLORIDE 0.9 % IV SOLN: 500.0000 mL | Freq: Once | INTRAVENOUS | Status: DC

## 2022-10-23 NOTE — Patient Instructions (Signed)
Discharge instructions given. Handout on polyps. Prescription sent to pharmacy. Office appointment scheduled. Office will call to schedule MR enterography. Resume previous medications. YOU HAD AN ENDOSCOPIC PROCEDURE TODAY AT THE  ENDOSCOPY CENTER:   Refer to the procedure report that was given to you for any specific questions about what was found during the examination.  If the procedure report does not answer your questions, please call your gastroenterologist to clarify.  If you requested that your care partner not be given the details of your procedure findings, then the procedure report has been included in a sealed envelope for you to review at your convenience later.  YOU SHOULD EXPECT: Some feelings of bloating in the abdomen. Passage of more gas than usual.  Walking can help get rid of the air that was put into your GI tract during the procedure and reduce the bloating. If you had a lower endoscopy (such as a colonoscopy or flexible sigmoidoscopy) you may notice spotting of blood in your stool or on the toilet paper. If you underwent a bowel prep for your procedure, you may not have a normal bowel movement for a few days.  Please Note:  You might notice some irritation and congestion in your nose or some drainage.  This is from the oxygen used during your procedure.  There is no need for concern and it should clear up in a day or so.  SYMPTOMS TO REPORT IMMEDIATELY:  Following lower endoscopy (colonoscopy or flexible sigmoidoscopy):  Excessive amounts of blood in the stool  Significant tenderness or worsening of abdominal pains  Swelling of the abdomen that is new, acute  Fever of 100F or higher  Following upper endoscopy (EGD)  Vomiting of blood or coffee ground material  New chest pain or pain under the shoulder blades  Painful or persistently difficult swallowing  New shortness of breath  Fever of 100F or higher  Black, tarry-looking stools  For urgent or emergent  issues, a gastroenterologist can be reached at any hour by calling [phone]. Do not use MyChart messaging for urgent concerns.    DIET:  We do recommend a small meal at first, but then you may proceed to your regular diet.  Drink plenty of fluids but you should avoid alcoholic beverages for 24 hours.  ACTIVITY:  You should plan to take it easy for the rest of today and you should NOT DRIVE or use heavy machinery until tomorrow (because of the sedation medicines used during the test).    FOLLOW UP: Our staff will call the number listed on your records the next business day following your procedure.  We will call around 7:15- 8:00 am to check on you and address any questions or concerns that you may have regarding the information given to you following your procedure. If we do not reach you, we will leave a message.     If any biopsies were taken you will be contacted by phone or by letter within the next 1-3 weeks.  Please call us at [phone] if you have not heard about the biopsies in 3 weeks.    SIGNATURES/CONFIDENTIALITY: You and/or your care partner have signed paperwork which will be entered into your electronic medical record.  These signatures attest to the fact that that the information above on your After Visit Summary has been reviewed and is understood.  Full responsibility of the confidentiality of this discharge information lies with you and/or your care-partner.

## 2022-10-23 NOTE — Op Note (Signed)
[facility] Patient Name: Francisco CapuchinJoy Lakins Procedure Date: 10/23/2022 1:08 PM MRN: [id number] Endoscopist: Wilhemina BonitoJohn N. Marina GoodellPerry , MD, [phone] Age: [age] Referring MD:  Date of Birth: 03-25-1981 Gender: Female Account #: [id number] Procedure:                Upper GI endoscopy Indications:              Generalized abdominal pain, Nausea with vomiting,                            Weight loss Medicines:                Monitored Anesthesia Care Procedure:                Pre-Anesthesia Assessment:                           - Prior to the procedure, a History and Physical                            was performed, and patient medications and                            allergies were reviewed. The patient's tolerance of                            previous anesthesia was also reviewed. The risks                            and benefits of the procedure and the sedation                            options and risks were discussed with the patient.                            All questions were answered, and informed consent                            was obtained. Prior Anticoagulants: The patient has                            taken no anticoagulant or antiplatelet agents. ASA                            Grade Assessment: II - A patient with mild systemic                            disease. After reviewing the risks and benefits,                            the patient was deemed in satisfactory condition to                            undergo the procedure.  After obtaining informed consent, the endoscope was                            passed under direct vision. Throughout the                            procedure, the patient's blood pressure, pulse, and                            oxygen saturations were monitored continuously. The                            Olympus scope [id number] was introduced through the                            mouth, and advanced to the second part of  duodenum.                            The upper GI endoscopy was accomplished without                            difficulty. The patient tolerated the procedure                            well. Scope In: Scope Out: Findings:                 The esophagus was normal.                           The stomach was normal.                           The examined duodenum was normal.                           The cardia and gastric fundus were normal on                            retroflexion. Complications:            No immediate complications. Estimated Blood Loss:     Estimated blood loss: none. Impression:               Normal EGD                           . Recommendation:           - Patient has a contact number available for                            emergencies. The signs and symptoms of potential                            delayed complications were discussed with the  patient. Return to normal activities tomorrow.                            Written discharge instructions were provided to the                            patient.                           - Resume previous diet.                           - Continue present medications.                           -See colonoscopy report for findings and                            recommendations Korayma Hagwood N. Marina Goodell, MD 10/23/2022 2:34:36 PM This report has been signed electronically.

## 2022-10-23 NOTE — Op Note (Signed)
[facility] Patient Name: Sharon Russo Procedure Date: 10/23/2022 1:09 PM MRN: [id number] Endoscopist: Wilhemina Bonito. Marina Goodell , MD, [id number] Age: 42 Referring MD:  Date of Birth: 20-Apr-1981 Gender: Female Account #: [id number] Procedure:                Colonoscopy with cold snare polypectomy x 1; with                            biopsies Indications:              Abdominal pain, Rectal bleeding, Weight loss Medicines:                Monitored Anesthesia Care Procedure:                Pre-Anesthesia Assessment:                           - Prior to the procedure, a History and Physical                            was performed, and patient medications and                            allergies were reviewed. The patient's tolerance of                            previous anesthesia was also reviewed. The risks                            and benefits of the procedure and the sedation                            options and risks were discussed with the patient.                            All questions were answered, and informed consent                            was obtained. Prior Anticoagulants: The patient has                            taken no anticoagulant or antiplatelet agents. ASA                            Grade Assessment: II - A patient with mild systemic                            disease. After reviewing the risks and benefits,                            the patient was deemed in satisfactory condition to                            undergo the procedure.  After obtaining informed consent, the colonoscope                            was passed under direct vision. Throughout the                            procedure, the patient's blood pressure, pulse, and                            oxygen saturations were monitored continuously. The                            CF [id number] #[id number] was introduced through the anus                            and advanced to  the the cecum, identified by                            appendiceal orifice and ileocecal valve. The                            ileocecal valve, appendiceal orifice, and rectum                            were photographed. The quality of the bowel                            preparation was good. The colonoscopy was performed                            without difficulty. The patient tolerated the                            procedure well. The bowel preparation used was                            SUPREP via split dose instruction. Scope In: 1:42:09 PM Scope Out: 2:04:32 PM Scope Withdrawal Time: 0 hours 16 minutes 36 seconds  Total Procedure Duration: 0 hours 22 minutes 23 seconds  Findings:                 The ileocecal valve contained a benign-appearing,                            intrinsic moderate stenosis that was non-traversed.                           A 3 mm polyp was found in the cecum. The polyp was                            sessile. The polyp was removed with a cold snare.                            Resection and retrieval were  complete.                           An area of significantly ulcerated mucosa was found                            in the left colon. This began approximately 20 cm                            from the anal verge and extended proximal for 25                            cm. There is deep ulceration and cobblestoning in                            this entire segment. Above this in the left colon                            and distal transverse colon are scattered ulcers.                            There were no gross abnormalities of the right                            colon or proximal transverse colon. As well, no                            gross abnormalities of the rectum. Biopsies of the                            ulcerative areas were taken with a cold forceps for                            histology.                           The exam was otherwise  without abnormality on                            direct and retroflexion views. Complications:            No immediate complications. Estimated blood loss:                            None. Estimated Blood Loss:     Estimated blood loss: none. Impression:               - Stricture at the ileocecal valve. Concerned about                            small bowel Crohn's                           - One 3 mm polyp in the cecum, removed with a cold  snare. Resected and retrieved.                           - Ulcerated mucosa in the left colon as described.                            Most consistent with Crohn's disease. Biopsied.                           - The examination was otherwise normal on direct                            and retroflexion views. Recommendation:           - Repeat colonoscopy (date not yet determined) for                            surveillance.                           - Patient has a contact number available for                            emergencies. The signs and symptoms of potential                            delayed complications were discussed with the                            patient. Return to normal activities tomorrow.                            Written discharge instructions were provided to the                            patient.                           - Resume previous diet.                           - Continue present medications.                           - Await pathology results.                           -Prescribe prednisone 20 mg; #60; 2 refills. Take                            40 mg by mouth once daily. Take in the morning.                           -Please schedule office appointment with Dr. Marina Goodell  for next Tuesday, October 31, 2022 at 9 AM. Open slot                            if closed.                           -Please schedule MR enterography ASAP. "Crohn's                             colitis, rule out small bowel Crohn's". Wilhemina BonitoJohn N. Marina GoodellPerry, MD 10/23/2022 2:30:56 PM This report has been signed electronically.

## 2022-10-23 NOTE — Progress Notes (Signed)
Called to room to assist during endoscopic procedure.  Patient ID and intended procedure confirmed with present staff. Received instructions for my participation in the procedure from the performing physician.  

## 2022-10-23 NOTE — Progress Notes (Signed)
Pt resting comfortably. VSS. Airway intact. SBAR complete to RN. All questions answered.   

## 2022-10-23 NOTE — Progress Notes (Signed)
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  She is accompanied today by her husband, Sharon Russo (who is a paramedic).   I have seen the patient remotely for abdominal complaints.  She underwent colonoscopy July 2008 to evaluate right-sided pain and questionable thickening of the right colon on CT scan.  The examination, including deep intubation of the terminal ileum, was normal.  Patient has done well for years without GI complaints and has not been seen back since that time.   Patient states that around August 2023 she began to develop problems with joint pains and mouth ulcers.  She was evaluated by rheumatologist.  She has been treated with meloxicam.  These problems continued until December when she noticed a change in bowel habits.  Previously 1 formed bowel movement daily.  Since that time, describes 3 or 4 mostly loose stools and gas with the frequent appearance of blood.  There is also lower abdominal discomfort which seems to ease off after defecation.  She has had ongoing problems with mouth ulcers and joint pains.  It makes it difficult to eat.  She is on Magic mouthwash.  There has been infrequent, but definite, nausea and vomiting.  She is lost 25 pounds.  Mother has a history of ulcerative colitis and required partial colectomy.  Review of outside blood work from December 2023 shows low vitamin D level of 17.7.  She was strep positive.  Normal B12.  In July, prior to becoming ill, normal comprehensive metabolic panel and normal CBC with hemoglobin 14.4.  TSH normal   REVIEW OF SYSTEMS:   All non-GI ROS negative unless otherwise stated in HPI except for aches, muscle cramps, fatigue, ankle swelling       Past Medical History:  Diagnosis Date   Hyperlipidemia     Hypertension     Migraines      Obesity     Vitamin D deficiency             Past Surgical History:  Procedure Laterality Date   APPENDECTOMY          Social History Sharon Russo Betsch  reports that she has never smoked. She has never used smokeless tobacco. She reports that she does not drink alcohol and does not use drugs.   family history includes Atrial fibrillation in her mother; Breast cancer (age of onset: 81) in her mother; CAD in her father; Dementia in her mother; Depression in her brother; Diabetes in her father and paternal grandmother; Hypertension in her maternal grandmother and mother; Kidney cancer in her mother.   No Known Allergies       PHYSICAL EXAMINATION: Vital signs: BP 126/76   Pulse 96   Ht 5\' 2"  (1.575 m)   Wt 168 lb (76.2 kg)   BMI 30.73 kg/m   Constitutional: generally well-appearing, no acute distress Psychiatric: alert and oriented x3, cooperative Eyes: extraocular movements intact, anicteric, conjunctiva pink Mouth: oral pharynx moist, no lesions Neck: supple no lymphadenopathy Cardiovascular: heart regular rate and rhythm, no murmur Lungs: clear to auscultation bilaterally Abdomen: soft, nontender, nondistended, no obvious ascites, no peritoneal signs, normal bowel sounds, no organomegaly Rectal: Deferred until colonoscopy Extremities: no clubbing, cyanosis, or lower extremity edema bilaterally Skin: no lesions on visible extremities Neuro: No focal deficits.  Cranial nerves intact  ASSESSMENT:   1.  73-month history of abdominal pain associated with loose stools and rectal bleeding.  Also, intermittent nausea and vomiting. 2.  Weight loss 3.  Family history ulcerative colitis 4.  Problems with joint aches and mouth ulcers.  Question extraintestinal manifestations of IBD.  Question primary rheumatologic disorder     PLAN:   1.  Schedule colonoscopy to evaluate abdominal pain, change in bowel habits, rectal bleeding and weight loss.The nature of the procedure, as  well as the risks, benefits, and alternatives were carefully and thoroughly reviewed with the patient. Ample time for discussion and questions allowed. The patient understood, was satisfied, and agreed to proceed. 2.  Schedule upper endoscopy to evaluate intermittent nausea and vomiting, abdominal pain, weight loss.The nature of the procedure, as well as the risks, benefits, and alternatives were carefully and thoroughly reviewed with the patient. Ample time for discussion and questions allowed. The patient understood, was satisfied, and agreed to proceed. 3.  Further recommendations after the above plan

## 2022-10-24 ENCOUNTER — Other Ambulatory Visit (HOSPITAL_COMMUNITY): Payer: Self-pay | Admitting: Skilled Nursing Facility

## 2022-10-24 ENCOUNTER — Telehealth: Payer: Self-pay

## 2022-10-24 DIAGNOSIS — K50119 Crohn's disease of large intestine with unspecified complications: Secondary | ICD-10-CM

## 2022-10-24 NOTE — Telephone Encounter (Signed)
No answer, left message to call if having any issues or concerns, B.Harlynn Kimbell RN 

## 2022-10-24 NOTE — Telephone Encounter (Signed)
Per [facility] results a MR enterography Abd/pelvis has been set up ASAP per Dr Marina Goodell for April 12th at [facility], arrive at 2pm for prepping. NPO 4 hours. Patient informed and verbalized understanding.

## 2022-10-26 ENCOUNTER — Encounter: Payer: Self-pay | Admitting: Internal Medicine

## 2022-10-27 ENCOUNTER — Ambulatory Visit (HOSPITAL_COMMUNITY)
Admission: RE | Admit: 2022-10-27 | Discharge: 2022-10-27 | Disposition: A | Discharge status: Home / Self Care | Payer: Managed Care, Other (non HMO) | Source: Ambulatory Visit | Attending: Internal Medicine | Admitting: Internal Medicine

## 2022-10-27 DIAGNOSIS — K50119 Crohn's disease of large intestine with unspecified complications: Secondary | ICD-10-CM

## 2022-10-27 MED ORDER — GADOBUTROL 1 MMOL/ML IV SOLN
7.0000 mL | Freq: Once | INTRAVENOUS | Status: AC | PRN
  Administered 2022-10-27: 7 mL via INTRAVENOUS

## 2022-10-31 ENCOUNTER — Encounter: Payer: Self-pay | Admitting: Internal Medicine

## 2022-10-31 ENCOUNTER — Other Ambulatory Visit (INDEPENDENT_AMBULATORY_CARE_PROVIDER_SITE_OTHER): Payer: Managed Care, Other (non HMO)

## 2022-10-31 ENCOUNTER — Ambulatory Visit (INDEPENDENT_AMBULATORY_CARE_PROVIDER_SITE_OTHER): Payer: Self-pay | Admitting: Internal Medicine

## 2022-10-31 VITALS — BP 122/68 | HR 63 | Ht 62.0 in | Wt 169.0 lb

## 2022-10-31 DIAGNOSIS — K50119 Crohn's disease of large intestine with unspecified complications: Secondary | ICD-10-CM | POA: Diagnosis not present

## 2022-10-31 DIAGNOSIS — R935 Abnormal findings on diagnostic imaging of other abdominal regions, including retroperitoneum: Secondary | ICD-10-CM

## 2022-10-31 DIAGNOSIS — K5080 Crohn's disease of both small and large intestine without complications: Secondary | ICD-10-CM | POA: Diagnosis not present

## 2022-10-31 LAB — CBC WITH DIFFERENTIAL/PLATELET
Basophils Absolute: 0 10*3/uL (ref 0.0–0.1)
Basophils Relative: 0.2 % (ref 0.0–3.0)
Eosinophils Absolute: 0 10*3/uL (ref 0.0–0.7)
Eosinophils Relative: 0.3 % (ref 0.0–5.0)
HCT: 36.1 % (ref 36.0–46.0)
Hemoglobin: 11.6 g/dL — ABNORMAL LOW (ref 12.0–15.0)
Lymphocytes Relative: 11.7 % — ABNORMAL LOW (ref 12.0–46.0)
Lymphs Abs: 1.7 10*3/uL (ref 0.7–4.0)
MCHC: 32.1 g/dL (ref 30.0–36.0)
MCV: 84.1 fl (ref 78.0–100.0)
Monocytes Absolute: 0.4 10*3/uL (ref 0.1–1.0)
Monocytes Relative: 2.7 % — ABNORMAL LOW (ref 3.0–12.0)
Neutro Abs: 12.7 10*3/uL — ABNORMAL HIGH (ref 1.4–7.7)
Neutrophils Relative %: 85.1 % — ABNORMAL HIGH (ref 43.0–77.0)
Platelets: 607 10*3/uL — ABNORMAL HIGH (ref 150.0–400.0)
RBC: 4.3 Mil/uL (ref 3.87–5.11)
RDW: 15.1 % (ref 11.5–15.5)
WBC: 15 10*3/uL — ABNORMAL HIGH (ref 4.0–10.5)

## 2022-10-31 LAB — BASIC METABOLIC PANEL
BUN: 12 mg/dL (ref 6–23)
CO2: 27 mEq/L (ref 19–32)
Calcium: 9.1 mg/dL (ref 8.4–10.5)
Chloride: 102 mEq/L (ref 96–112)
Creatinine, Ser: 0.85 mg/dL (ref 0.40–1.20)
GFR: 83.42 mL/min (ref >60.00)
Glucose, Bld: 89 mg/dL (ref 70–99)
Potassium: 4 mEq/L (ref 3.5–5.1)
Sodium: 139 mEq/L (ref 135–145)

## 2022-10-31 LAB — HEPATIC FUNCTION PANEL
ALT: 41 U/L — ABNORMAL HIGH (ref 0–35)
AST: 23 U/L (ref 0–37)
Albumin: 3.5 g/dL (ref 3.5–5.2)
Alkaline Phosphatase: 59 U/L (ref 39–117)
Bilirubin, Direct: 0.1 mg/dL (ref 0.0–0.3)
Total Bilirubin: 0.5 mg/dL (ref 0.2–1.2)
Total Protein: 7.2 g/dL (ref 6.0–8.3)

## 2022-10-31 LAB — HIGH SENSITIVITY CRP: CRP, High Sensitivity: 7.04 mg/L — ABNORMAL HIGH (ref 0.000–5.000)

## 2022-10-31 LAB — TSH: TSH: 2.91 u[IU]/mL (ref 0.35–5.50)

## 2022-10-31 NOTE — Patient Instructions (Addendum)
_______________________________________________________  If your blood pressure at your visit was 140/90 or greater, please contact your primary care physician to follow up on this.  _______________________________________________________  If you are age 44 or older, your body mass index should be between 23-30. Your Body mass index is 30.91 kg/m. If this is out of the aforementioned range listed, please consider follow up with your Primary Care Provider.  If you are age 37 or younger, your body mass index should be between 19-25. Your Body mass index is 30.91 kg/m. If this is out of the aformentioned range listed, please consider follow up with your Primary Care Provider.   ________________________________________________________  The [facility] providers would like to encourage you to use [facility] to communicate with providers for non-urgent requests or questions.  Due to long hold times on the telephone, sending your provider a message by [facility] may be a faster and more efficient way to get a response.  Please allow 48 business hours for a response.  Please remember that this is for non-urgent requests.  _______________________________________________________  Look up [facility] ([facility] [place]) on the internet.  Continue Prednisone   Please follow up with Dr. Marina Goodell on 11/30/2022 at 11:00am

## 2022-10-31 NOTE — Progress Notes (Signed)
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  Please see that dictation for details.  She presents today for follow-up after colonoscopy.  She is again accompanied by her husband, Sharon Russo.  Sharon Russo underwent complete colonoscopy and upper endoscopy October 23, 2022.  Colonoscopy revealed segmental ulceration and cobblestoning in the left colon over a segment of 25 cm.  See that report.  Endoscopic appearance was consistent with Crohn's disease.  Colonic biopsies revealed ulceration.  Remainder of the colon was grossly normal.  The ileum was swollen and would not allow entry of the colonoscope.  This was concerning for possible small bowel disease.  Upper endoscopy was normal.  She subsequently underwent MR enterography of the abdomen and pelvis.  She was found to have a 10 cm segment of distal small bowel mucosal enhancement and submucosal edema with luminal narrowing consistent with Crohn's disease.  No fistula or abscess.  Mild proximal dilation of the small bowel and anticipated abnormalities of the left colon as noted on colonoscopy.  She was placed on prednisone 40 mg daily and follows up today.  She is pleased to report that she is markedly improved.  No significant abdominal pain.  Less diarrhea.  Better appetite.  Also her joint aches and mouth ulcers have improved.  She is pleased.  Had a little trouble sleeping the first few days of prednisone, but is otherwise tolerating the drug well  REVIEW OF SYSTEMS:  All non-GI ROS negative unless otherwise stated in the HPI except for ankle edema (not new)  Past Medical History:  Diagnosis Date   Hyperlipidemia    Hypertension    Migraines    Obesity    Vitamin D deficiency     Past Surgical History:  Procedure Laterality Date   APPENDECTOMY      Social History Sharon Russo  reports that she has never smoked. She has never used smokeless tobacco. She reports that she does not drink alcohol and does not use drugs.  family history includes Atrial fibrillation in her mother; Breast cancer (age of onset: 73) in her mother; CAD in her father; Dementia in her mother; Depression in her brother; Diabetes in her father and paternal grandmother; Hypertension in her maternal grandmother and mother; Kidney cancer in her mother.  No Known Allergies     PHYSICAL EXAMINATION: Vital signs: BP 122/68   Pulse 63   Ht  (1.575 m)   Wt 169 lb (76.7 kg)   BMI 30.91 kg/m   Constitutional: generally well-appearing, no acute distress Psychiatric: alert and oriented x3, cooperative Eyes: extraocular movements intact, anicteric, conjunctiva pink Mouth: oral pharynx moist, no lesions Neck: supple no lymphadenopathy Cardiovascular: heart regular rate and rhythm, no murmur Lungs: clear to auscultation bilaterally Abdomen: soft, nontender, nondistended, no obvious ascites, no peritoneal signs, normal bowel sounds, no organomegaly Rectal: Omitted Extremities: no clubbing or cyanosis.  Trace to 1+ lower extremity edema bilaterally Skin: no lesions on visible extremities Neuro: No focal deficits.  Cranial nerves intact  ASSESSMENT:  1.  Crohn's disease involving the colon (left colon) and terminal ileum as described.  Multiple symptoms improved on prednisone.   PLAN:  1.  LONG educational discussion on Crohn's disease.  We discussed the etiology, pathophysiology, complications, treatment strategies, and outcomes.  Multiple questions answered. 2.  Continue prednisone 40 mg  daily 3.  Reviewed information on Crohn's disease.  Provided. 4.  Reviewed information on biologic agents and immunomodulators.  Provided.  Also discussed. 5.  Encouraged to join the [employer] and colitis [facility] of [place] 6.  Blood work today including CBC, comprehensive metabolic panel,  C-reactive protein, hepatitis serologies, QuantiFERON gold testing. 7.  Office follow-up in about 4 weeks.  We will move forward with plan long-term treatment and vaccinations as needed. Total time of 60 minutes was spent preparing to see the patient, obtaining comprehensive interval history, performing medically appropriate physical exam, counseling and educating the patient and her husband regarding multiple above listed issues, answering questions, ordering medication, ordering laboratories, arranging follow-up, and documenting clinical information in the health record

## 2022-11-01 LAB — HEPATITIS A ANTIBODY, TOTAL: Hepatitis A AB,Total: NONREACTIVE

## 2022-11-01 LAB — HEPATITIS C ANTIBODY: Hepatitis C Ab: NONREACTIVE

## 2022-11-01 LAB — HEPATITIS B SURFACE ANTIBODY,QUALITATIVE: Hep B S Ab: REACTIVE — AB

## 2022-11-03 LAB — QUANTIFERON-TB GOLD PLUS
QuantiFERON Mitogen Value: 0.07 IU/mL
QuantiFERON Nil Value: 0.01 IU/mL
QuantiFERON TB1 Ag Value: 0.02 IU/mL
QuantiFERON TB2 Ag Value: 0 IU/mL
QuantiFERON-TB Gold Plus: UNDETERMINED — AB

## 2022-11-30 ENCOUNTER — Other Ambulatory Visit (INDEPENDENT_AMBULATORY_CARE_PROVIDER_SITE_OTHER): Payer: 59

## 2022-11-30 ENCOUNTER — Ambulatory Visit (INDEPENDENT_AMBULATORY_CARE_PROVIDER_SITE_OTHER): Payer: 59 | Admitting: Internal Medicine

## 2022-11-30 ENCOUNTER — Encounter: Payer: Self-pay | Admitting: Internal Medicine

## 2022-11-30 VITALS — BP 100/68 | HR 80 | Ht 62.0 in | Wt 173.5 lb

## 2022-11-30 DIAGNOSIS — K5080 Crohn's disease of both small and large intestine without complications: Secondary | ICD-10-CM | POA: Diagnosis not present

## 2022-11-30 DIAGNOSIS — Z23 Encounter for immunization: Secondary | ICD-10-CM | POA: Diagnosis not present

## 2022-11-30 LAB — HIGH SENSITIVITY CRP: CRP, High Sensitivity: 27.65 mg/L — ABNORMAL HIGH (ref 0.000–5.000)

## 2022-11-30 NOTE — Progress Notes (Signed)
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  She subsequently underwent upper endoscopy, colonoscopy, and MR enterography.  She was found to have left-sided ileal Crohn's disease.  She was placed on prednisone 40 mg daily.  Last seen in the office October 31, 2019.  Better.  See that dictation for details.  She has optimal blood work.  Was provided information on Crohn's disease and medical therapies for Crohn's disease including biologic agents and immunomodulators.  Her point-of-care testing was indeterminate.  She follows up at this time.  She is accompanied by her husband.  She reports that she is doing very well.  She has some vague short-lived abdominal discomfort in the mornings which resolves without recurrence.  She has 1-2 formed bowel movements daily.  No bleeding.  Good appetite with weight gain.  Tolerating prednisone.  She did review the literature provided.  REVIEW OF SYSTEMS:  All non-GI ROS negative unless otherwise stated in the HPI.  Past Medical History:  Diagnosis Date   Hyperlipidemia    Hypertension    Migraines    Obesity    Vitamin D deficiency     Past Surgical History:  Procedure Laterality Date   APPENDECTOMY      Social History Sharon Russo  reports that she has never smoked. She has never used smokeless tobacco. She reports that she does not drink alcohol and does not use drugs.  family history includes Atrial fibrillation in her mother; Breast cancer (age of onset: 51) in her mother; CAD in her father; Dementia in her mother; Depression in her brother; Diabetes in her father and paternal grandmother; Hypertension in her maternal grandmother and mother; Kidney cancer in her mother.  No Known Allergies     PHYSICAL EXAMINATION: Vital signs: BP 100/68 (BP Location: Left Arm,  Patient Position: Sitting, Cuff Size: Normal)   Pulse 80   Ht 5\' 2"  (1.575 m)   Wt 173 lb 8 oz (78.7 kg)   BMI 31.73 kg/m   Constitutional: generally well-appearing, no acute distress Psychiatric: alert and oriented x3, cooperative Eyes: extraocular movements intact, anicteric, conjunctiva pink Mouth: oral pharynx moist, no lesions Neck: supple no lymphadenopathy Cardiovascular: heart regular rate and rhythm, no murmur Lungs: clear to auscultation bilaterally Abdomen: soft, nontender, nondistended, no obvious ascites, no peritoneal signs, normal bowel sounds, no organomegaly Rectal: Limited Extremities: no clubbing, signs lower extremity edema bilaterally Skin: no lesions on visible extremities Neuro: No focal deficits.  Cranial nerves intact  ASSESSMENT:  1.  Crohn's disease involving the left colon and terminal ileum.  Doing well on prednisone.   PLAN:  1.  Prednisone taper.  Decrease to 30 mg daily for 2 weeks then 20 mg daily until follow-up. 2.  Vaccinate to hepatitis A and B 3.  Vaccinate to zoster (with PCP) 4.  TPMT assessment 5.  CRP 6.  Office follow-up 4 to 5 weeks 7.  Anticipate initiating Humira along with immunomodulatory therapy (if TPMT assessment favorable, 6-MP 100 mg daily).  I discussed in detail the action of each medication as well as risks.  We discussed the data supporting their use, particularly dual therapy.  They understand.  Her husband, who is a paramedic, is here today. A total time of 40 minutes spent preparing to see the patient, reviewing interval data, obtaining interval history, performing medically appropriate  physical examination, ordering blood work, ordering medication, adjusting medication dosage, counseling and educating the patient and her husband regarding the above listed issues, and documenting clinical information in the health record

## 2022-11-30 NOTE — Patient Instructions (Signed)
_______________________________________________________  If your blood pressure at your visit was 140/90 or greater, please contact your primary care physician to follow up on this.  _______________________________________________________  If you are age 44 or older, your body mass index should be between 23-30. Your Body mass index is 31.73 kg/m. If this is out of the aforementioned range listed, please consider follow up with your Primary Care Provider.  If you are age 87 or younger, your body mass index should be between 19-25. Your Body mass index is 31.73 kg/m. If this is out of the aformentioned range listed, please consider follow up with your Primary Care Provider.   ________________________________________________________  The [facility] providers would like to encourage you to use [facility] to communicate with providers for non-urgent requests or questions.  Due to long hold times on the telephone, sending your provider a message by [facility] may be a faster and more efficient way to get a response.  Please allow 48 business hours for a response.  Please remember that this is for non-urgent requests.  _______________________________________________________  Your provider has requested that you go to the basement level for lab work before leaving today. Press "B" on the elevator. The lab is located at the first door on the left as you exit the elevator.

## 2022-12-08 ENCOUNTER — Telehealth: Payer: Self-pay | Admitting: Internal Medicine

## 2022-12-08 NOTE — Telephone Encounter (Signed)
Pt states her insurance will not cover Humira. Can you tell us what they will cover for the biologics?

## 2022-12-08 NOTE — Telephone Encounter (Signed)
PT wants to make Dr. Marina Goodell aware that the Humira he wants her to start is not covered by insurance. Would like to discuss options

## 2022-12-08 NOTE — Telephone Encounter (Signed)
Pt wanted to let Dr. Marina Goodell know that she was supposed to start Humira in June but her insurance will not cover the Humira. She will need another option.

## 2022-12-08 NOTE — Telephone Encounter (Signed)
Please let me know the options

## 2022-12-12 ENCOUNTER — Other Ambulatory Visit (HOSPITAL_COMMUNITY): Payer: Self-pay

## 2022-12-12 ENCOUNTER — Telehealth: Payer: Self-pay | Admitting: Pharmacy Technician

## 2022-12-12 NOTE — Telephone Encounter (Signed)
Unsure what other biologics are covered. Test billing with [facility] was unsuccessful due to SPECIALTY DRUG:PRODUCT/SERVICE NOT APPROPRIATE FOR THIS LOCATION. Submitted a PA for Humira 40mg  with loading dose. IF it denies it should provide alternative that her plan will cover. If not, patient will need to call insurance to see what IS covered.

## 2022-12-12 NOTE — Telephone Encounter (Signed)
Patient Advocate Encounter  Received notification from [employer] that prior authorization for HUMIRA 40MG  is required.   PA submitted on 5.28.24 Key [id number] Status is pending

## 2022-12-13 ENCOUNTER — Other Ambulatory Visit: Payer: Self-pay

## 2022-12-13 ENCOUNTER — Other Ambulatory Visit (HOSPITAL_COMMUNITY): Payer: Self-pay

## 2022-12-13 LAB — THIOPURINE METHYLTRANSFERASE (TPMT), RBC: Thiopurine Methyltransferase, RBC: 18 nmol/hr/mL RBC

## 2022-12-13 MED ORDER — HUMIRA-CD/UC/HS STARTER 80 MG/0.8ML ~~LOC~~ AJKT: AUTO-INJECTOR | SUBCUTANEOUS | 0 refills | Status: AC

## 2022-12-13 MED ORDER — HUMIRA (2 PEN) 40 MG/0.4ML ~~LOC~~ AJKT: 40.0000 mg | AUTO-INJECTOR | SUBCUTANEOUS | 11 refills | Status: DC

## 2022-12-13 NOTE — Telephone Encounter (Signed)
Prescriptions sent to pharmacy as requested 

## 2022-12-13 NOTE — Telephone Encounter (Signed)
Patient Advocate Encounter  Prior Authorization for HUMIRA 40MG  has been approved with [employer].    PA# [id number] Effective dates: 5.28.24 through 5.28.25  Per [facility] test claim,

## 2022-12-14 ENCOUNTER — Telehealth: Payer: Self-pay

## 2022-12-14 NOTE — Telephone Encounter (Signed)
Spoke with patient and moved her 01/02/23 appt that I accidentally double booked to 01/15/2023

## 2023-01-02 ENCOUNTER — Ambulatory Visit: Payer: Self-pay | Admitting: Internal Medicine

## 2023-01-02 ENCOUNTER — Ambulatory Visit (INDEPENDENT_AMBULATORY_CARE_PROVIDER_SITE_OTHER): Payer: 59 | Admitting: Internal Medicine

## 2023-01-02 DIAGNOSIS — K5090 Crohn's disease, unspecified, without complications: Secondary | ICD-10-CM

## 2023-01-02 DIAGNOSIS — Z23 Encounter for immunization: Secondary | ICD-10-CM | POA: Diagnosis not present

## 2023-01-02 NOTE — Progress Notes (Signed)
Patient presented for her hepatitis vaccination today in anticipation of biologic and immune therapy for her inflammatory bowel disease

## 2023-01-12 ENCOUNTER — Other Ambulatory Visit: Payer: Self-pay | Admitting: Obstetrics & Gynecology

## 2023-01-12 DIAGNOSIS — Z1231 Encounter for screening mammogram for malignant neoplasm of breast: Secondary | ICD-10-CM

## 2023-01-15 ENCOUNTER — Encounter: Payer: Self-pay | Admitting: Internal Medicine

## 2023-01-15 ENCOUNTER — Ambulatory Visit: Payer: 59 | Admitting: Internal Medicine

## 2023-01-15 ENCOUNTER — Other Ambulatory Visit (INDEPENDENT_AMBULATORY_CARE_PROVIDER_SITE_OTHER): Payer: 59

## 2023-01-15 VITALS — BP 104/72 | HR 86 | Ht 62.0 in | Wt 190.0 lb

## 2023-01-15 DIAGNOSIS — R935 Abnormal findings on diagnostic imaging of other abdominal regions, including retroperitoneum: Secondary | ICD-10-CM | POA: Diagnosis not present

## 2023-01-15 DIAGNOSIS — K5090 Crohn's disease, unspecified, without complications: Secondary | ICD-10-CM

## 2023-01-15 DIAGNOSIS — R634 Abnormal weight loss: Secondary | ICD-10-CM

## 2023-01-15 DIAGNOSIS — K5080 Crohn's disease of both small and large intestine without complications: Secondary | ICD-10-CM | POA: Diagnosis not present

## 2023-01-15 LAB — COMPREHENSIVE METABOLIC PANEL
ALT: 12 U/L (ref 0–35)
AST: 9 U/L (ref 0–37)
Albumin: 3.8 g/dL (ref 3.5–5.2)
Alkaline Phosphatase: 47 U/L (ref 39–117)
BUN: 15 mg/dL (ref 6–23)
CO2: 27 mEq/L (ref 19–32)
Calcium: 9.5 mg/dL (ref 8.4–10.5)
Chloride: 103 mEq/L (ref 96–112)
Creatinine, Ser: 1.17 mg/dL (ref 0.40–1.20)
GFR: 56.77 mL/min — ABNORMAL LOW (ref >60.00)
Glucose, Bld: 89 mg/dL (ref 70–99)
Potassium: 4.2 mEq/L (ref 3.5–5.1)
Sodium: 140 mEq/L (ref 135–145)
Total Bilirubin: 0.4 mg/dL (ref 0.2–1.2)
Total Protein: 6.9 g/dL (ref 6.0–8.3)

## 2023-01-15 LAB — CBC WITH DIFFERENTIAL/PLATELET
Basophils Absolute: 0.1 10*3/uL (ref 0.0–0.1)
Basophils Relative: 0.5 % (ref 0.0–3.0)
Eosinophils Absolute: 0.1 10*3/uL (ref 0.0–0.7)
Eosinophils Relative: 0.3 % (ref 0.0–5.0)
HCT: 40.5 % (ref 36.0–46.0)
Hemoglobin: 12.8 g/dL (ref 12.0–15.0)
Lymphocytes Relative: 10.1 % — ABNORMAL LOW (ref 12.0–46.0)
Lymphs Abs: 2.1 10*3/uL (ref 0.7–4.0)
MCHC: 31.7 g/dL (ref 30.0–36.0)
MCV: 88 fl (ref 78.0–100.0)
Monocytes Absolute: 1.1 10*3/uL — ABNORMAL HIGH (ref 0.1–1.0)
Monocytes Relative: 5.1 % (ref 3.0–12.0)
Neutro Abs: 17.5 10*3/uL — ABNORMAL HIGH (ref 1.4–7.7)
Neutrophils Relative %: 84 % — ABNORMAL HIGH (ref 43.0–77.0)
Platelets: 405 10*3/uL — ABNORMAL HIGH (ref 150.0–400.0)
RBC: 4.6 Mil/uL (ref 3.87–5.11)
RDW: 15 % (ref 11.5–15.5)
WBC: 20.9 10*3/uL (ref 4.0–10.5)

## 2023-01-15 LAB — C-REACTIVE PROTEIN: CRP: 2.6 mg/dL (ref 0.5–20.0)

## 2023-01-15 MED ORDER — MERCAPTOPURINE 50 MG PO TABS: 100.0000 mg | ORAL_TABLET | Freq: Every day | ORAL | 11 refills | Status: DC

## 2023-01-15 NOTE — Progress Notes (Signed)
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  She subsequently underwent upper endoscopy and colonoscopy (October 23, 2022), and MR enterography (October 27, 2022).  She was found to have left-sided and ileal Crohn's disease.  She was placed on prednisone 40 mg daily.  She was subsequently seen in the office October 31, 2019 and doing better.  See that dictation.   Her last office visit was Nov 30, 2022.  At that time prednisone taper initiated.  Plans for vaccination hepatitis a and B as well as zoster made.  TPMT testing showed her to be a normal metabolizer.  She reviewed information on Humira and biologic therapy.  She has been in touch with the nurse educator for Humira and has her medication, but has not initiated therapy yet.  Waiting for this appointment.  Currently on 20 mg of prednisone daily.  Reports 1-2 mostly formed bowel movements per day without bleeding.  She continues with steady weight gain.  Some bloating.  Otherwise doing well.  She is looking forward to starting school next month.  REVIEW OF SYSTEMS:  All non-GI ROS negative unless otherwise stated in the HPI except for headaches and fatigue  Past Medical History:  Diagnosis Date   Hyperlipidemia    Hypertension    Migraines    Obesity    Vitamin D deficiency     Past Surgical History:  Procedure Laterality Date   APPENDECTOMY      Social History Fathia Holes Jaco  reports that she has never smoked. She has never used smokeless tobacco. She reports that she does not drink alcohol and does not use drugs.  family history includes Atrial fibrillation in her mother; Breast cancer (age of onset: 55) in her mother; CAD in her father; Dementia in her mother; Depression in her brother; Diabetes in her father and paternal grandmother; Hypertension in her  maternal grandmother and mother; Kidney cancer in her mother.  No Known Allergies     PHYSICAL EXAMINATION: Vital signs: BP 104/72   Pulse 86   Ht 5\' 2"  (1.575 m)   Wt 190 lb (86.2 kg)   BMI 34.75 kg/m   Constitutional: generally well-appearing, no acute distress.  Somewhat cushingoid appearing Psychiatric: alert and oriented x3, cooperative Eyes: extraocular movements intact, anicteric, conjunctiva pink Mouth: oral pharynx moist, no lesions Neck: supple no lymphadenopathy Cardiovascular: heart regular rate and rhythm, no murmur Lungs: clear to auscultation bilaterally Abdomen: soft, obese, nontender, nondistended, no obvious ascites, no peritoneal signs, normal bowel sounds, no organomegaly Rectal: Omitted Extremities: no clubbing, cyanosis, or lower extremity edema bilaterally Skin: no lesions on visible extremities Neuro: No focal deficits.  Cranial nerves intact  ASSESSMENT:   1.  Crohn's disease involving the left colon and terminal ileum.  Doing well on prednisone. 2.  Normal TPMT metabolizer 3.  QuantiFERON gold testing indeterminate range 4.  Getting up-to-date with vaccinations     PLAN:   1.  Continue prednisone taper.  Decrease to 10 mg daily for 1 week, then 5 mg daily for 1 week, then stop. 2.  Begin Humira at this time.  Induction (160 mg, 80 mg, 40 mg) followed by maintenance regimen of 40 mg once every 2 weeks 3.  Prescribe 6-mercaptopurine 100 mg daily.  Medication risks reviewed 4.  Repeat CBC in 4 weeks 5.  Routine  office follow-up 2 months 6.  Contact the office in the interim for any questions or problems  A total time of 40 minutes spent preparing to see the patient, reviewing interval data, obtaining interval history, performing medically appropriate physical examination, ordering blood work, ordering medication, adjusting other medication dosage, counseling and educating the patient regarding the above listed issues, arranging follow-up, and and  documenting clinical information in the health record

## 2023-01-15 NOTE — Patient Instructions (Addendum)
Your provider has requested that you go to the basement level for lab work before leaving today. Press "B" on the elevator. The lab is located at the first door on the left as you exit the elevator.   You will need repeat labs in 1 month (02/15/23). You do not need an appointment. Our lab is open M-F from 7:30-5:00 pm.   Taper Prednisone as directed by Dr Marina Goodell.   Start Humira as per Dr Marina Goodell.   We have sent the following medications to your pharmacy for you to pick up at your convenience: Mercaptopurine 100mg -once daily.  Follow-up with Dr. Marina Goodell on 05/08/23 at 9:20 am. _______________________________________________________  If your blood pressure at your visit was 140/90 or greater, please contact your primary care physician to follow up on this.  _______________________________________________________  If you are age 44 or older, your body mass index should be between 23-30. Your Body mass index is 34.75 kg/m. If this is out of the aforementioned range listed, please consider follow up with your Primary Care Provider.  If you are age 44 or younger, your body mass index should be between 19-25. Your Body mass index is 34.75 kg/m. If this is out of the aformentioned range listed, please consider follow up with your Primary Care Provider.   ________________________________________________________  The [facility] providers would like to encourage you to use [facility] to communicate with providers for non-urgent requests or questions.  Due to long hold times on the telephone, sending your provider a message by [facility] may be a faster and more efficient way to get a response.  Please allow 48 business hours for a response.  Please remember that this is for non-urgent requests.  _______________________________________________________  Due to recent changes in healthcare laws, you may see the results of your imaging and laboratory studies on MyChart before your provider has had a chance to  review them.  We understand that in some cases there may be results that are confusing or concerning to you. Not all laboratory results come back in the same time frame and the provider may be waiting for multiple results in order to interpret others.  Please give [place] 48 hours in order for your provider to thoroughly review all the results before contacting the office for clarification of your results.   Thank you for choosing me and [facility].  Dr.John Marina Goodell

## 2023-01-16 ENCOUNTER — Encounter: Payer: Self-pay | Admitting: Internal Medicine

## 2023-01-17 ENCOUNTER — Telehealth: Payer: Self-pay | Admitting: Internal Medicine

## 2023-01-17 NOTE — Telephone Encounter (Signed)
Erie Noe with [facility] called for a PA on Humira. The copay is 1780 after deductible. Please advise.

## 2023-01-22 ENCOUNTER — Other Ambulatory Visit (HOSPITAL_COMMUNITY): Payer: Self-pay

## 2023-01-22 NOTE — Telephone Encounter (Signed)
Left message for pt to call back. Message also sent via mychart.

## 2023-01-22 NOTE — Telephone Encounter (Signed)
Patient is eligible for HUMIRA Complete Savings programs, which provide additional savings and may reduce the cost of HUMIRA to as little as $0 a month*. They can register at [email or web address] to request a rebate or they can provide their savings card details to their filling pharmacy.  Card: [id number] Issued:01/22/2023 Rx GROUP: [id number] Rx BIN: [id number] Rx PCN: [id number] Suf:[id number]

## 2023-01-24 NOTE — Telephone Encounter (Signed)
Left message for pt regarding copay card and approval and for pt to call if she has any questions.

## 2023-01-25 ENCOUNTER — Ambulatory Visit (INDEPENDENT_AMBULATORY_CARE_PROVIDER_SITE_OTHER): Payer: 59 | Admitting: Obstetrics & Gynecology

## 2023-01-25 ENCOUNTER — Encounter: Payer: Self-pay | Admitting: Obstetrics & Gynecology

## 2023-01-25 VITALS — BP 120/82 | HR 89 | Ht 62.25 in | Wt 193.0 lb

## 2023-01-25 DIAGNOSIS — Z01419 Encounter for gynecological examination (general) (routine) without abnormal findings: Secondary | ICD-10-CM

## 2023-01-25 DIAGNOSIS — Z3041 Encounter for surveillance of contraceptive pills: Secondary | ICD-10-CM

## 2023-01-25 MED ORDER — NORETHIN ACE-ETH ESTRAD-FE 1-20 MG-MCG(24) PO TABS: 1.0000 | ORAL_TABLET | Freq: Every day | ORAL | 4 refills | Status: DC

## 2023-01-25 NOTE — Progress Notes (Signed)
Sharon Russo 1980-11-24 [id number]   History:    42 y.o.  G1P1L1 married.  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  Son is 63 yo, rising sophomore.   RP:  Established patient presenting for annual gyn exam   HPI: Well on Junel Fe 1/20 continuously for withdrawal migraines.  No BTB.  No pelvic pain.  No pain with IC.  Pap Neg 01/2021.  No h/o abnormal Pap.  Will repeat a Pap at 1 yrs. Urine/BMs normal. Breasts normal.  Mammo Neg 02/2022.  BMI 35.02.  Lower carbs and walking every day.  Health labs with Fam MD. Alen Bleacher 10/2022. Crohn's Disease on Prednisone, weaning; recently started on Humira.   Past medical history,surgical history, family history and social history were all reviewed and documented in the EPIC chart.  Gynecologic History No LMP recorded. (Menstrual status: Oral contraceptives).  Obstetric History OB History  Gravida Para Term Preterm AB Living  1 1 1    0 1  SAB IAB Ectopic Multiple Live Births      0        # Outcome Date GA Lbr Len/2nd Weight Sex Type Anes PTL Lv  1 Term              ROS: A ROS was performed and pertinent positives and negatives are included in the history. GENERAL: No fevers or chills. HEENT: No change in vision, no earache, sore throat or sinus congestion. NECK: No pain or stiffness. CARDIOVASCULAR: No chest pain or pressure. No palpitations. PULMONARY: No shortness of breath, cough or wheeze. GASTROINTESTINAL: No abdominal pain, nausea, vomiting or diarrhea, melena or bright red blood per rectum. GENITOURINARY: No urinary frequency, urgency, hesitancy or dysuria. MUSCULOSKELETAL: No joint or muscle pain, no back pain, no recent trauma. DERMATOLOGIC: No rash, no itching, no lesions. ENDOCRINE: No polyuria, polydipsia, no heat or cold intolerance. No recent change in weight. HEMATOLOGICAL: No anemia or easy bruising or bleeding. NEUROLOGIC: No headache, seizures, numbness, tingling or weakness. PSYCHIATRIC: No depression, no loss of interest in normal activity or change in  sleep pattern.     Exam:   BP 120/82   Pulse 89   Ht 5' 2.25" (1.581 m)   Wt 193 lb (87.5 kg)   PF 98 L/min   BMI 35.02 kg/m   Body mass index is 35.02 kg/m.  General appearance : Well developed well nourished female. No acute distress HEENT: Eyes: no retinal hemorrhage or exudates,  Neck supple, trachea midline, no carotid bruits, no thyroidmegaly Lungs: Clear to auscultation, no rhonchi or wheezes, or rib retractions  Heart: Regular rate and rhythm, no murmurs or gallops Breast:Examined in sitting and supine position were symmetrical in appearance, no palpable masses or tenderness,  no skin retraction, no nipple inversion, no nipple discharge, no skin discoloration, no axillary or supraclavicular lymphadenopathy Abdomen: no palpable masses or tenderness, no rebound or guarding Extremities: no edema or skin discoloration or tenderness  Pelvic: Vulva: Normal             Vagina: No gross lesions or discharge  Cervix: No gross lesions or discharge  Uterus  AV, normal size, shape and consistency, non-tender and mobile  Adnexa  Without masses or tenderness  Anus: Normal   Assessment/Plan:  42 y.o. female for annual exam   1. Well female exam with routine gynecological exam Well on Junel Fe 1/20 continuously for withdrawal migraines.  No BTB.  No pelvic pain.  No pain with IC.  Pap Neg 01/2021.  No h/o abnormal  Pap.  Will repeat a Pap at 1 yrs. Urine/BMs normal. Breasts normal.  Mammo Neg 02/2022.  BMI 35.02.  Lower carbs and walking every day.  Health labs with Fam MD. Alen Bleacher 10/2022. Crohn's Disease on Prednisone, weaning; recently started on Humira.  2. Encounter for surveillance of contraceptive pills Well on Junel Fe 1/20 continuously for withdrawal migraines.  No BTB.  No pelvic pain.  No pain with IC.  No CI to continue on BCPs, prescription sent to pharmacy.  Other orders - Norethindrone Acetate-Ethinyl Estrad-FE (LOESTRIN 24 FE) 1-20 MG-MCG(24) tablet; Take 1 tablet by  mouth daily. Continuous use for Migraines   [name] MD, 11:11 AM

## 2023-02-07 ENCOUNTER — Other Ambulatory Visit: Payer: Self-pay | Admitting: *Deleted

## 2023-02-07 ENCOUNTER — Telehealth: Payer: Self-pay | Admitting: Internal Medicine

## 2023-02-07 MED ORDER — PREDNISONE 10 MG PO TABS: 20.0000 mg | ORAL_TABLET | Freq: Every day | ORAL | 0 refills | Status: DC

## 2023-02-07 NOTE — Telephone Encounter (Signed)
Returned patient call in reference to having started Humira medication. Patient states her 2nd dose of Humira was last Tuesday at 80mg  and is scheduled for next Tuesday starting 40mg  as her 3rd dose of Humira. Patient also states she has been tapered off the Prednisone for 7 days, and is presently having sharp intermittent abdominal pains at a pain level 8. Patient states she vomited x's 1 last night and has hip and knee joint pain as well. Please advise.

## 2023-02-07 NOTE — Telephone Encounter (Signed)
1.  Have her resume prednisone 20 mg daily (make sure she has available medication). 2.  Continue with Humira 3.  Liquid diet for 24 to 48 hours.  Advance to soft foods, then regular foods as tolerated. 4.  Have her give [place] a follow-up call in 1 week, regarding her status Thanks, Dr. Marina Goodell

## 2023-02-07 NOTE — Telephone Encounter (Signed)
Patient called requesting a call back from the nurse stating she is having a Crohn's flare up seeking advise.

## 2023-02-07 NOTE — Telephone Encounter (Signed)
Called patient to inform of Dr. Lamar Sprinkles orders and recommendations.  Order to resume Prednisone medication 20mg  daily, as well as liquid diet for 24-48 hours, advance to soft foods, then regular foods as tolerated. Continue Humira as ordered and give call back in 1 week to give [place] a follow up regarding her status. Patient understood and agreed.

## 2023-02-13 NOTE — Telephone Encounter (Signed)
Inbound call from patient, states she has been experiencing vomiting and also chrone symptoms has gotten worse. Patient is inquiring if the symptoms she is experiencing are side affect from her taking Mercaptopourine. Please advise.  Thank you

## 2023-02-13 NOTE — Telephone Encounter (Signed)
Pt reports she has been taking prednisone 20mg  daily since last week and was told to call back with an update. She was vomiting last week. Pt reports she was doing ok until today and she started vomiting again. States she will feel good for a day to 2 and then she starts feeling worse, reports today had been the worst. Pt wonders if the 6mp might be causing her vomiting, she has been taking it since June. Please advise.

## 2023-02-14 ENCOUNTER — Other Ambulatory Visit: Payer: Self-pay

## 2023-02-14 DIAGNOSIS — K5090 Crohn's disease, unspecified, without complications: Secondary | ICD-10-CM

## 2023-02-14 DIAGNOSIS — I10 Essential (primary) hypertension: Secondary | ICD-10-CM | POA: Diagnosis not present

## 2023-02-14 DIAGNOSIS — E559 Vitamin D deficiency, unspecified: Secondary | ICD-10-CM | POA: Diagnosis not present

## 2023-02-14 DIAGNOSIS — Z0001 Encounter for general adult medical examination with abnormal findings: Secondary | ICD-10-CM | POA: Diagnosis not present

## 2023-02-14 DIAGNOSIS — Z1212 Encounter for screening for malignant neoplasm of rectum: Secondary | ICD-10-CM | POA: Diagnosis not present

## 2023-02-14 DIAGNOSIS — E785 Hyperlipidemia, unspecified: Secondary | ICD-10-CM | POA: Diagnosis not present

## 2023-02-14 MED ORDER — ONDANSETRON HCL 4 MG PO TABS: ORAL_TABLET | ORAL | 2 refills | Status: AC

## 2023-02-14 NOTE — Telephone Encounter (Signed)
Sharon Russo, 1.  Hold 6-mercaptopurine for now (though it is uncertain whether this is causing her symptoms). 2.  Increase prednisone to 40 mg daily 3.  Prescribe Zofran 4 mg p.o. every 4-6 hours as needed nausea and vomiting.;  #60; 2 refills 4.  Have her come in for CBC, CRP, c-Met, and lipase 5.  Have her call in 1 week with update.  If she is feeling better, we might reintroduce 6-MP at that time. Thanks, Dr. Marina Goodell

## 2023-02-14 NOTE — Telephone Encounter (Signed)
Spoke with pt and she is aware of recommendations per Dr. Marina Goodell. She will increase prednisone to 40mg  daily, come for labs, and call with an update in 7 days.

## 2023-02-21 DIAGNOSIS — I10 Essential (primary) hypertension: Secondary | ICD-10-CM | POA: Diagnosis not present

## 2023-02-21 DIAGNOSIS — K50918 Crohn's disease, unspecified, with other complication: Secondary | ICD-10-CM | POA: Diagnosis not present

## 2023-02-21 DIAGNOSIS — Z7961 Long term (current) use of immunomodulator: Secondary | ICD-10-CM | POA: Diagnosis not present

## 2023-02-21 DIAGNOSIS — R82998 Other abnormal findings in urine: Secondary | ICD-10-CM | POA: Diagnosis not present

## 2023-02-21 DIAGNOSIS — E669 Obesity, unspecified: Secondary | ICD-10-CM | POA: Diagnosis not present

## 2023-02-21 DIAGNOSIS — Z0000 Encounter for general adult medical examination without abnormal findings: Secondary | ICD-10-CM | POA: Diagnosis not present

## 2023-02-21 DIAGNOSIS — E785 Hyperlipidemia, unspecified: Secondary | ICD-10-CM | POA: Diagnosis not present

## 2023-02-21 DIAGNOSIS — Z1331 Encounter for screening for depression: Secondary | ICD-10-CM | POA: Diagnosis not present

## 2023-02-21 NOTE — Telephone Encounter (Signed)
Pt called back and states she is feeling good now and not having any issues. She did not come for labs, states she had labs with PCP and results were faxed over from Dr. Jacky Kindle. She did not increase the prednisone to 40mg , she was nervous doing this so she stayed at 20mg  daily. Pt calling to see what she needs to do regarding 6mp. Please advise.

## 2023-02-21 NOTE — Telephone Encounter (Signed)
1.  Resume 6-MP at previous dose.  Will see how she does 2.  Have her give [place] follow-up in 1 week.  Sooner if needed

## 2023-02-21 NOTE — Telephone Encounter (Signed)
Spoke with pt and she is aware of recommendations per Dr. Marina Goodell, she knows to call back with an update in 1 week.

## 2023-02-21 NOTE — Telephone Encounter (Signed)
Inbound call from patient in regards to previous note. States she has not had any complications in the last week and would like to f/u with a nurse to discuss next plan of care. Please advise.   Thank you

## 2023-02-27 ENCOUNTER — Ambulatory Visit
Admission: RE | Admit: 2023-02-27 | Discharge: 2023-02-27 | Disposition: A | Discharge status: Home / Self Care | Payer: 59 | Source: Ambulatory Visit | Attending: Obstetrics & Gynecology | Admitting: Obstetrics & Gynecology

## 2023-02-27 DIAGNOSIS — Z1231 Encounter for screening mammogram for malignant neoplasm of breast: Secondary | ICD-10-CM | POA: Diagnosis not present

## 2023-02-28 ENCOUNTER — Encounter: Payer: Self-pay | Admitting: Internal Medicine

## 2023-03-12 ENCOUNTER — Encounter: Payer: Self-pay | Admitting: Internal Medicine

## 2023-03-14 ENCOUNTER — Other Ambulatory Visit: Payer: Self-pay | Admitting: Internal Medicine

## 2023-03-26 ENCOUNTER — Telehealth: Payer: Self-pay | Admitting: Internal Medicine

## 2023-03-26 NOTE — Telephone Encounter (Signed)
Patient calling back with an update, states she is doing well and wants to know what the next steps are. Below are what her last instructions were:  Hilarie Fredrickson, MD    1.  Stay off 6-MP 2.  Decrease prednisone to 20 mg daily, and stay on that dosage until further notice 3.  Continue Humira 4.  Give [place] a follow-up phone call in 2 weeks (sooner if needed). Thanks, Dr.  Marina Goodell

## 2023-03-26 NOTE — Telephone Encounter (Signed)
Inbound call from patient states she has been doing well since taken off medication and would like to be further advised on next steps.

## 2023-03-27 NOTE — Telephone Encounter (Signed)
1.  Continue to stay off 6-MP 2.  Decrease prednisone to 10 mg daily, and stay on that dosage until further notice 3.  Continue Humira 4.  Give [place] a follow-up phone call in 2 weeks (sooner if needed). 5.  It looks like she has an office visit with me in October.  Keep that appointment. Thanks, Dr.  Marina Goodell

## 2023-03-27 NOTE — Telephone Encounter (Signed)
Spoke with pt and she is aware of recommendations per Dr. Marina Goodell.

## 2023-04-11 NOTE — Telephone Encounter (Signed)
Inbound call from patient, wanted to advise that her change in medications has been overall a success. Patient does comment on some bruising on her arms and legs and would like to discuss if any of the medication changes could cause this side effect.

## 2023-04-12 NOTE — Telephone Encounter (Signed)
Pt states she has had a few small flares but has been doing well on her meds. She has been having some bruising on her arms and legs and wanted to know if this could be a side effect of her meds. Please advise.

## 2023-04-12 NOTE — Telephone Encounter (Signed)
Pt states she is already at 10mg  daily based on previous instructions. She states she will stay at that dose for 4 weeks then go down to the 5mg  dose. She knows to come for labs prior to the appt.

## 2023-04-12 NOTE — Telephone Encounter (Signed)
1.  Stay on prednisone 20 mg daily for 2 weeks 2.  After 2 weeks, decrease to 10 mg daily for 2 weeks 3.  Then, 5 mg daily until office follow-up on October 22. 4.  Continue Humira 5.  In 4 weeks (just before her appointment) have her come in for CBC, comprehensive metabolic panel, and C-reactive protein Thanks, Dr. Marina Goodell

## 2023-04-23 ENCOUNTER — Telehealth: Payer: Self-pay | Admitting: Internal Medicine

## 2023-04-23 NOTE — Telephone Encounter (Signed)
Inbound call from patient, calling due to a change in insurance. Patient states new insurance requires a prior authorization on Humira pen. Would like for that to be sent.

## 2023-05-04 ENCOUNTER — Other Ambulatory Visit (INDEPENDENT_AMBULATORY_CARE_PROVIDER_SITE_OTHER): Payer: 59

## 2023-05-04 ENCOUNTER — Other Ambulatory Visit (HOSPITAL_COMMUNITY): Payer: Self-pay

## 2023-05-04 ENCOUNTER — Telehealth: Payer: Self-pay | Admitting: Pharmacy Technician

## 2023-05-04 DIAGNOSIS — K5090 Crohn's disease, unspecified, without complications: Secondary | ICD-10-CM

## 2023-05-04 LAB — COMPREHENSIVE METABOLIC PANEL
ALT: 12 U/L (ref 0–35)
AST: 13 U/L (ref 0–37)
Albumin: 4.3 g/dL (ref 3.5–5.2)
Alkaline Phosphatase: 38 U/L — ABNORMAL LOW (ref 39–117)
BUN: 12 mg/dL (ref 6–23)
CO2: 26 meq/L (ref 19–32)
Calcium: 9.1 mg/dL (ref 8.4–10.5)
Chloride: 104 meq/L (ref 96–112)
Creatinine, Ser: 0.93 mg/dL (ref 0.40–1.20)
GFR: 74.62 mL/min (ref >60.00)
Glucose, Bld: 103 mg/dL — ABNORMAL HIGH (ref 70–99)
Potassium: 3.5 meq/L (ref 3.5–5.1)
Sodium: 140 meq/L (ref 135–145)
Total Bilirubin: 0.4 mg/dL (ref 0.2–1.2)
Total Protein: 7.4 g/dL (ref 6.0–8.3)

## 2023-05-04 LAB — CBC WITH DIFFERENTIAL/PLATELET
Basophils Absolute: 0.1 10*3/uL (ref 0.0–0.1)
Basophils Relative: 0.5 % (ref 0.0–3.0)
Eosinophils Absolute: 0 10*3/uL (ref 0.0–0.7)
Eosinophils Relative: 0.1 % (ref 0.0–5.0)
HCT: 43.5 % (ref 36.0–46.0)
Hemoglobin: 14.1 g/dL (ref 12.0–15.0)
Lymphocytes Relative: 12.1 % (ref 12.0–46.0)
Lymphs Abs: 1.5 10*3/uL (ref 0.7–4.0)
MCHC: 32.3 g/dL (ref 30.0–36.0)
MCV: 92 fL (ref 78.0–100.0)
Monocytes Absolute: 0.6 10*3/uL (ref 0.1–1.0)
Monocytes Relative: 4.7 % (ref 3.0–12.0)
Neutro Abs: 10 10*3/uL — ABNORMAL HIGH (ref 1.4–7.7)
Neutrophils Relative %: 82.6 % — ABNORMAL HIGH (ref 43.0–77.0)
Platelets: 305 10*3/uL (ref 150.0–400.0)
RBC: 4.73 Mil/uL (ref 3.87–5.11)
RDW: 14.9 % (ref 11.5–15.5)
WBC: 12.2 10*3/uL — ABNORMAL HIGH (ref 4.0–10.5)

## 2023-05-04 LAB — C-REACTIVE PROTEIN: CRP: <1 mg/dL (ref 0.5–20.0)

## 2023-05-04 LAB — LIPASE: Lipase: 26 U/L (ref 11.0–59.0)

## 2023-05-04 NOTE — Telephone Encounter (Signed)
Inbound call from patient requesting a call regarding prior authorization for Humira pen. Please advise, thank you.

## 2023-05-04 NOTE — Telephone Encounter (Signed)
  Pt states she has new insurance, it was [employer] but it is now [employer]. Did you use [place] [employer]?

## 2023-05-04 NOTE — Telephone Encounter (Signed)
Spoke with pt and got the following information:  RxBin#[id number] RxPcn#[id number] RxGroup#[id number] GN#[id number]

## 2023-05-04 NOTE — Telephone Encounter (Signed)
Pharmacy Patient Advocate Encounter   Received notification from Pt Calls Messages that prior authorization for HUMIRA 40MG  is required/requested.   Insurance verification completed.   The patient is insured through [facility] .   Per test claim: PA required; PA submitted to [facility] via CoverMyMeds Key/confirmation #/EOC [facility] Status is pending

## 2023-05-04 NOTE — Telephone Encounter (Signed)
Pt aware. She knows to call back if she has any other issues.

## 2023-05-04 NOTE — Telephone Encounter (Signed)
Pt calling to check on PA for Humira, original note sent 10/7.

## 2023-05-04 NOTE — Telephone Encounter (Signed)
Pharmacy Patient Advocate Encounter  Received notification from [facility] that Prior Authorization for HUMIRA 40MG  has been APPROVED from 10.18.24 to 10.18.25   PA #/Case ID/Reference #: [id number]

## 2023-05-08 ENCOUNTER — Encounter: Payer: Self-pay | Admitting: Internal Medicine

## 2023-05-08 ENCOUNTER — Ambulatory Visit: Payer: 59 | Admitting: Internal Medicine

## 2023-05-08 VITALS — BP 122/68 | HR 87 | Ht 62.0 in | Wt 197.0 lb

## 2023-05-08 DIAGNOSIS — K5080 Crohn's disease of both small and large intestine without complications: Secondary | ICD-10-CM

## 2023-05-08 DIAGNOSIS — R109 Unspecified abdominal pain: Secondary | ICD-10-CM

## 2023-05-08 MED ORDER — DICYCLOMINE HCL 20 MG PO TABS: ORAL_TABLET | ORAL | 2 refills | Status: DC

## 2023-05-08 NOTE — Patient Instructions (Signed)
We have sent the following medications to your pharmacy for you to pick up at your convenience:  Bentyl  Take your Prednisone 5mg  daily for two weeks and then stop.  _______________________________________________________  If your blood pressure at your visit was 140/90 or greater, please contact your primary care physician to follow up on this.  _______________________________________________________  If you are age 44 or older, your body mass index should be between 23-30. Your Body mass index is 36.03 kg/m. If this is out of the aforementioned range listed, please consider follow up with your Primary Care Provider.  If you are age 53 or younger, your body mass index should be between 19-25. Your Body mass index is 36.03 kg/m. If this is out of the aformentioned range listed, please consider follow up with your Primary Care Provider.   ________________________________________________________  The [facility] providers would like to encourage you to use [facility] to communicate with providers for non-urgent requests or questions.  Due to long hold times on the telephone, sending your provider a message by [facility] may be a faster and more efficient way to get a response.  Please allow 48 business hours for a response.  Please remember that this is for non-urgent requests.  _______________________________________________________

## 2023-05-08 NOTE — Progress Notes (Signed)
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  She subsequently underwent upper endoscopy and colonoscopy (October 23, 2022), and MR enterography (October 27, 2022).  She was found to have left-sided and ileal Crohn's disease.  She was placed on prednisone 40 mg daily.  She was subsequently seen in the office October 31, 2019 and doing better.  See that dictation.   She was then seen Nov 30, 2022.  At that time prednisone taper initiated.  Plans for vaccination hepatitis a and B as well as zoster made.  TPMT testing showed her to be a normal metabolizer.  She reviewed information on Humira and biologic therapy.  She has been in touch with the nurse educator for Humira and has her medication, but has not initiated therapy yet as she was waiting for that appointment.  At that time she was on 20 mg of prednisone daily and reported 1-2 mostly formed bowel movements per day without bleeding.  She was last seen January 15, 2023.  Prednisone was decreased to 10 mg daily.  Humira was to begin shortly.  6-MP prescribed.  Follow-up in a couple of months recommended.  Seems that she had intolerance to 6-MP with repeated episodes of nausea and vomiting abdominal pain while on the medication.  Thus, this was stopped.  She is continued on Humira.  She is undergoing a steroid taper.  Currently on 5 mg prednisone.  Occasional joint aches.  She did have a bout of abdominal cramping yesterday which lasted about 6 hours and was followed by vomiting and diarrhea.  Feels better today.  Recent blood work May 04, 2023 showed essentially normal comprehensive metabolic panel.  Normal lipase.  Normal C-reactive protein.  CBC normal except for mild leukocytosis (12.2).  She has no other complaints at this time except for some easy bruisability.  No fevers.  She  has gained 7 pounds since her last visit.  She is accompanied today by her husband.   REVIEW OF SYSTEMS:  All non-GI ROS negative unless otherwise stated in the HPI. Past Medical History:  Diagnosis Date   Crohn's disease (HCC)    Hyperlipidemia    Hypertension    Migraines    Obesity    Vitamin D deficiency     Past Surgical History:  Procedure Laterality Date   APPENDECTOMY      Social History Saidi M Piech  reports that she has never smoked. She has never used smokeless tobacco. She reports that she does not drink alcohol and does not use drugs.  family history includes Atrial fibrillation in her mother; Breast cancer (age of onset: 71) in her mother; CAD in her father; Dementia in her mother; Depression in her brother; Diabetes in her father and paternal grandmother; Hypertension in her maternal grandmother and mother; Kidney cancer in her mother.  No Known Allergies     PHYSICAL EXAMINATION: Vital signs: BP 122/68   Pulse 87   Ht 5\' 2"  (1.575 m)   Wt 197 lb (89.4 kg)   BMI 36.03 kg/m   Constitutional: generally well-appearing, no acute distress Psychiatric: alert and oriented x3, cooperative Eyes: extraocular movements intact, anicteric, conjunctiva pink Mouth: oral pharynx moist, no lesions Neck: supple no lymphadenopathy Cardiovascular: heart regular rate and rhythm, no murmur Lungs: clear to auscultation bilaterally Abdomen: soft, nontender, nondistended, no obvious ascites,  no peritoneal signs, normal bowel sounds, no organomegaly Rectal: Omitted Extremities: no clubbing, cyanosis, or lower extremity edema bilaterally Skin: no lesions on visible extremities Neuro: No focal deficits.  Cranial nerves tact  ASSESSMENT:  1.  Crohn's disease involving the left colon and terminal ileum.  Except for abdominal complaints yesterday, as described, doing well.  Now on prednisone 5 mg daily.  Has been on Humira for about 3 months. 2.  Intolerant to  6-mercaptopurine   PLAN:  1.  Continue prednisone 5 mg daily for 2 weeks then stop 2.  Continue Humira 40 mg every 2 weeks 3.  Prescribe Bentyl 20 mg p.o. every 4-6 hours as needed cramping pain 4.  Office follow-up 3 months.  Contact the office in the interim for any questions or problems. A total time of 30 minutes was spent preparing to see the patient, obtaining interval history, performing medically appropriate physical examination, counseling and educating the patient and her husband regarding the above listed issues, prescribing medication, defining follow-up parameters, and documenting clinical information in the health record

## 2023-05-28 ENCOUNTER — Telehealth: Payer: Self-pay

## 2023-05-28 NOTE — Telephone Encounter (Signed)
Pt scheduled for 2nd hep a

## 2023-05-28 NOTE — Telephone Encounter (Signed)
-----   Message from [facility] W sent at 11/30/2022 12:25 PM EDT ----- Hep a due (11/30/2022)

## 2023-06-12 ENCOUNTER — Ambulatory Visit: Payer: 59 | Admitting: Internal Medicine

## 2023-06-12 DIAGNOSIS — K5080 Crohn's disease of both small and large intestine without complications: Secondary | ICD-10-CM

## 2023-06-12 DIAGNOSIS — Z23 Encounter for immunization: Secondary | ICD-10-CM | POA: Diagnosis not present

## 2023-06-12 DIAGNOSIS — Z9229 Personal history of other drug therapy: Secondary | ICD-10-CM

## 2023-06-12 IMAGING — MG MM DIGITAL SCREENING BILAT W/ TOMO AND CAD
8 series · 8 of 24 positions shown · non-contrast
Comparison: Previous exam(s).

CLINICAL DATA: Screening.

EXAM:
DIGITAL SCREENING BILATERAL MAMMOGRAM WITH TOMOSYNTHESIS AND CAD
TECHNIQUE: Bilateral screening digital craniocaudal and mediolateral oblique
mammograms were obtained. Bilateral screening digital breast
tomosynthesis was performed. The images were evaluated with
computer-aided detection.

[R CC synth-2D]
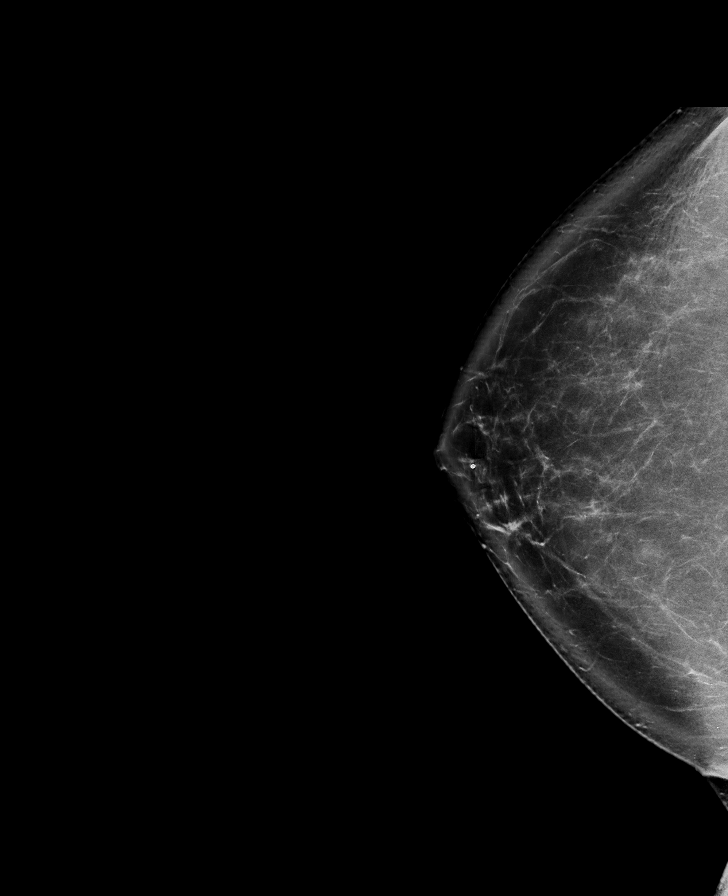

[L CC synth-2D]
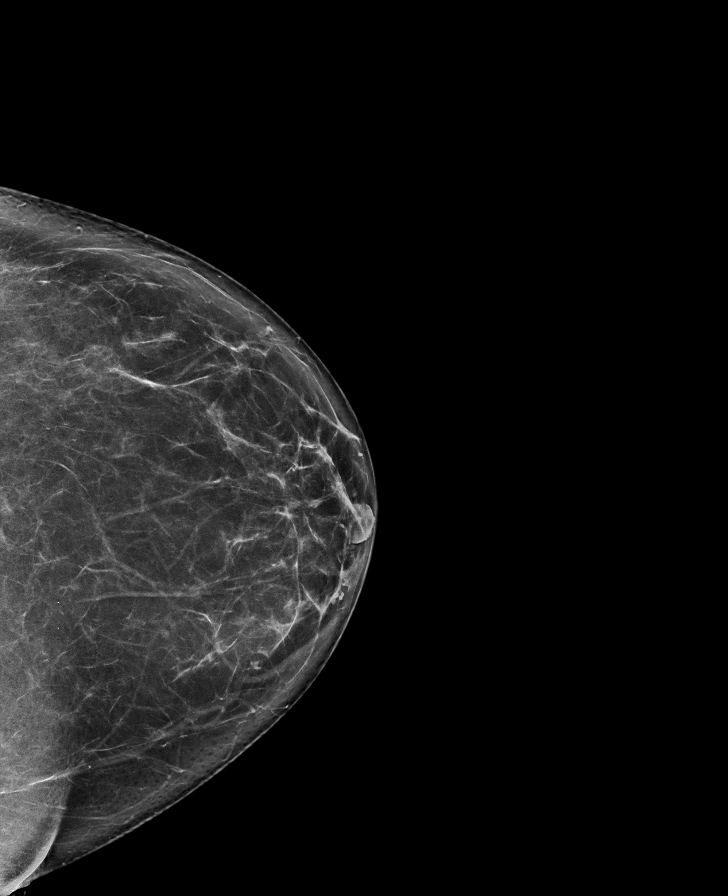

[L MLO synth-2D]
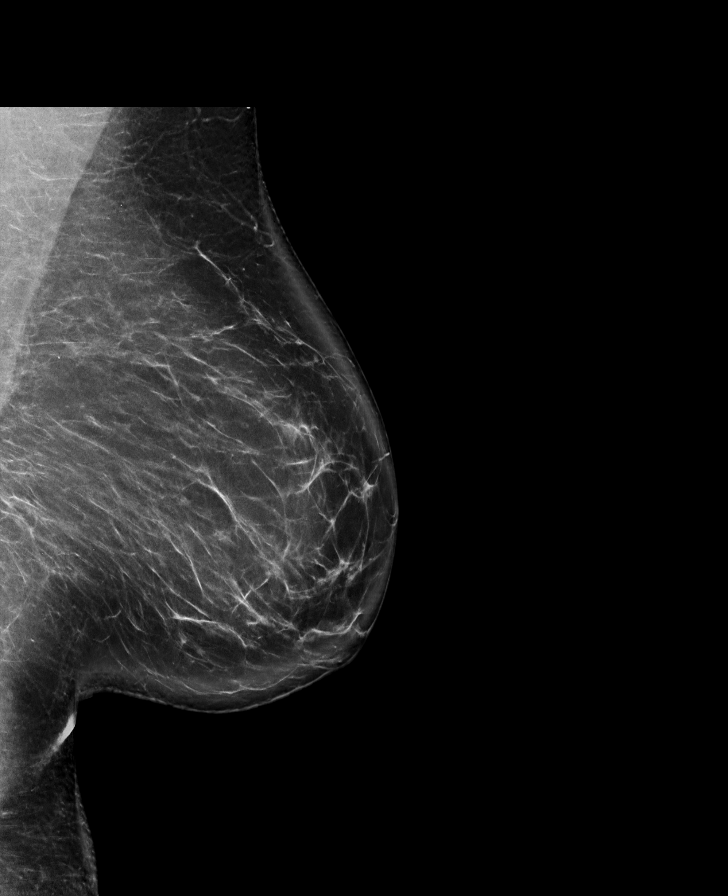

[R MLO synth-2D]
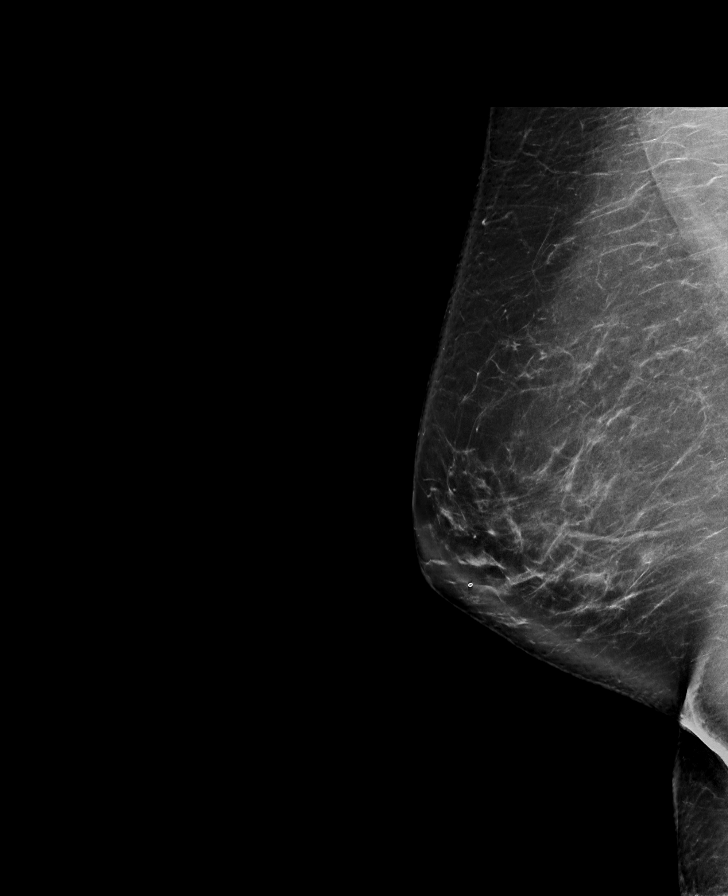

[L CC tomo · tomo slice 44/87.0]
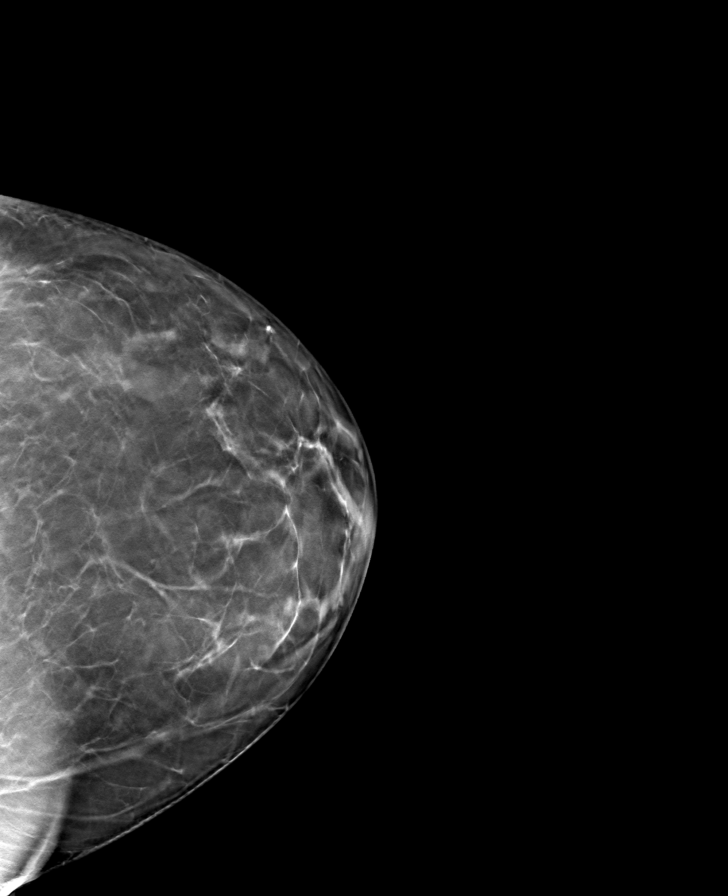

[R CC tomo · tomo slice 50/99.0]
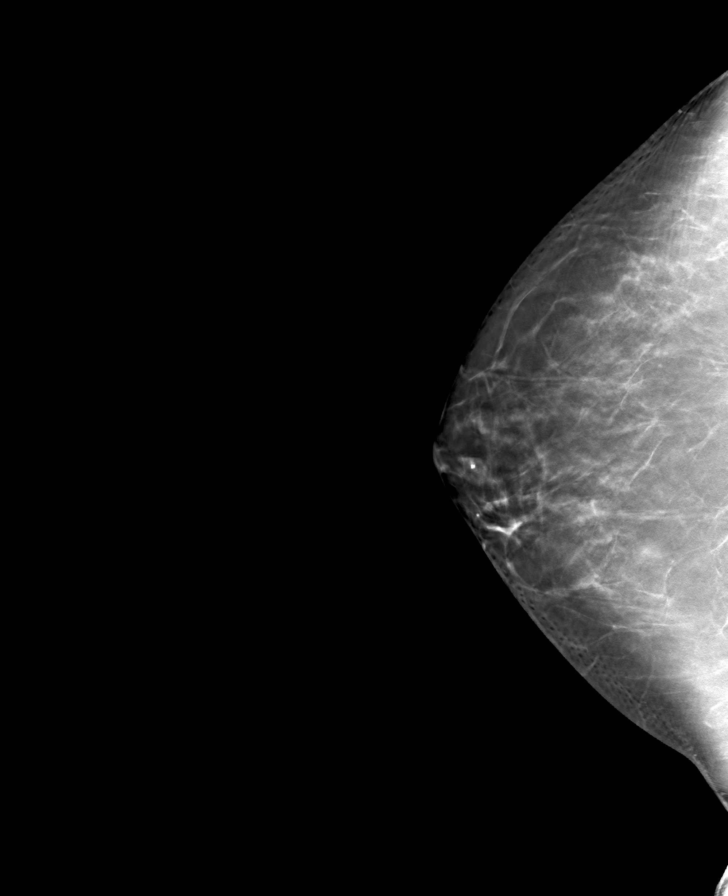

[L MLO tomo · tomo slice 53/104.0]
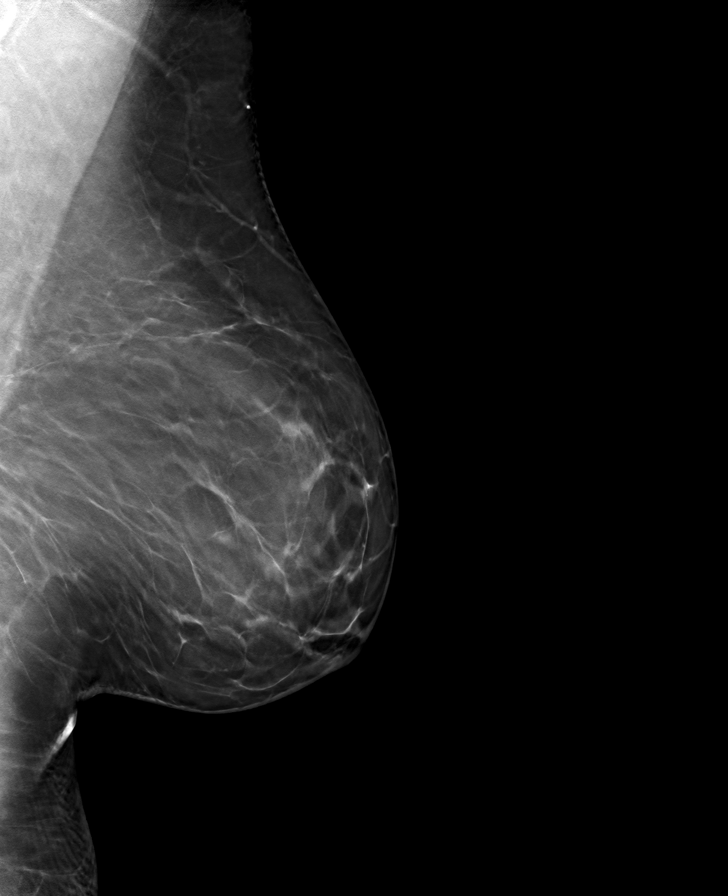

[R MLO tomo · tomo slice 51/101.0]
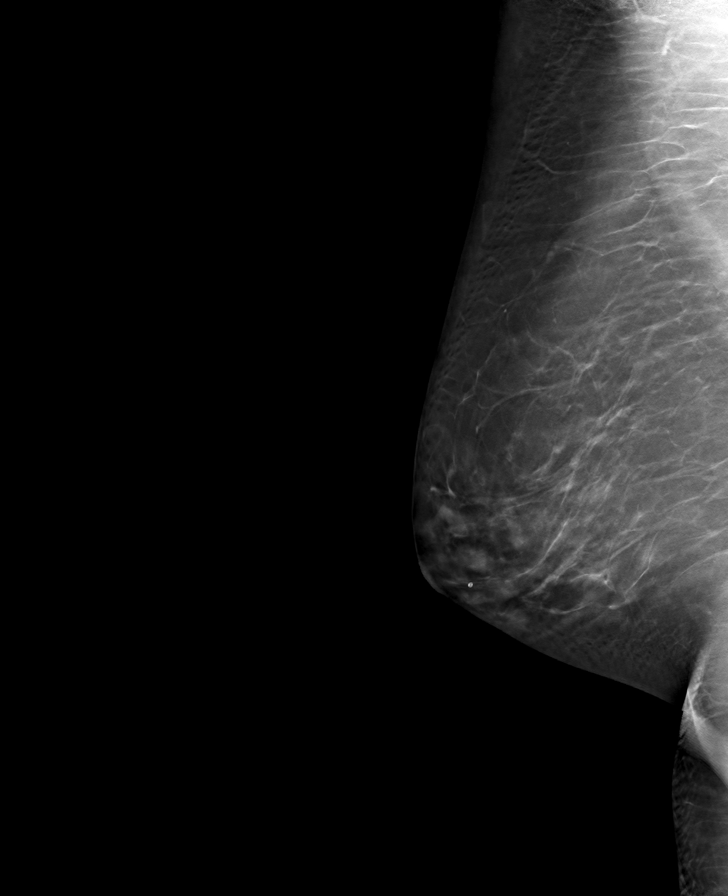

[8 of 24 positions shown; findings below may reference images not displayed]

ACR Breast Density Category b: There are scattered areas of
fibroglandular density.
FINDINGS: There are no findings suspicious for malignancy.
IMPRESSION: No mammographic evidence of malignancy. A result letter of this
screening mammogram will be mailed directly to the patient.

RECOMMENDATION:
Screening mammogram in one year. (Code:51-O-LD2)

BI-RADS CATEGORY  1: Negative.

## 2023-06-12 NOTE — Progress Notes (Unsigned)
Pt presents today for nurse visit for an injection. Per Dr. Marina Goodell, injection of Hepatitis A given in left deltoid today by Cristela Felt, CMA. Denies any adverse reactions or discomfort.

## 2023-06-13 ENCOUNTER — Ambulatory Visit: Payer: 59

## 2023-06-13 NOTE — Progress Notes (Signed)
Nursing visit for Hep A vaccine

## 2023-07-02 ENCOUNTER — Telehealth: Payer: Self-pay | Admitting: Internal Medicine

## 2023-07-02 NOTE — Telephone Encounter (Signed)
I would recommend that she see a rheumatologist.  Thanks

## 2023-07-02 NOTE — Telephone Encounter (Signed)
Pt reports she came off of prednisone about 4 weeks ago. Reports her joint pain came back but now it is really bothering her. Reports her shoulders, elbows, and wrists really are bothering her. States the pain is constant. States it is hard to get out of bed in the am. She wants to know if Dr. Marina Goodell has any recommendations Please advise.

## 2023-07-02 NOTE — Telephone Encounter (Signed)
Patient called and stated that her joint pain has increased being on prednisone. Patient is requesting a call back. Please advise.

## 2023-07-02 NOTE — Telephone Encounter (Signed)
Pt aware and states she has seen one in the past. She knows to contact our office if she needs another referral or anything further.

## 2023-08-10 ENCOUNTER — Encounter: Payer: Self-pay | Admitting: Internal Medicine

## 2023-08-10 ENCOUNTER — Ambulatory Visit: Payer: 59 | Admitting: Internal Medicine

## 2023-08-10 ENCOUNTER — Other Ambulatory Visit (INDEPENDENT_AMBULATORY_CARE_PROVIDER_SITE_OTHER): Payer: 59

## 2023-08-10 VITALS — BP 122/70 | HR 79 | Ht 62.0 in | Wt 201.0 lb

## 2023-08-10 DIAGNOSIS — K5080 Crohn's disease of both small and large intestine without complications: Secondary | ICD-10-CM | POA: Diagnosis not present

## 2023-08-10 DIAGNOSIS — R112 Nausea with vomiting, unspecified: Secondary | ICD-10-CM

## 2023-08-10 DIAGNOSIS — R1110 Vomiting, unspecified: Secondary | ICD-10-CM | POA: Diagnosis not present

## 2023-08-10 DIAGNOSIS — K5090 Crohn's disease, unspecified, without complications: Secondary | ICD-10-CM

## 2023-08-10 DIAGNOSIS — M2550 Pain in unspecified joint: Secondary | ICD-10-CM | POA: Diagnosis not present

## 2023-08-10 DIAGNOSIS — R109 Unspecified abdominal pain: Secondary | ICD-10-CM

## 2023-08-10 LAB — CBC WITH DIFFERENTIAL/PLATELET
Basophils Absolute: 0 10*3/uL (ref 0.0–0.1)
Basophils Relative: 0.4 % (ref 0.0–3.0)
Eosinophils Absolute: 0.1 10*3/uL (ref 0.0–0.7)
Eosinophils Relative: 1 % (ref 0.0–5.0)
HCT: 43 % (ref 36.0–46.0)
Hemoglobin: 14.1 g/dL (ref 12.0–15.0)
Lymphocytes Relative: 18.6 % (ref 12.0–46.0)
Lymphs Abs: 2.1 10*3/uL (ref 0.7–4.0)
MCHC: 32.9 g/dL (ref 30.0–36.0)
MCV: 90.8 fL (ref 78.0–100.0)
Monocytes Absolute: 1.1 10*3/uL — ABNORMAL HIGH (ref 0.1–1.0)
Monocytes Relative: 9.8 % (ref 3.0–12.0)
Neutro Abs: 7.8 10*3/uL — ABNORMAL HIGH (ref 1.4–7.7)
Neutrophils Relative %: 70.2 % (ref 43.0–77.0)
Platelets: 319 10*3/uL (ref 150.0–400.0)
RBC: 4.73 Mil/uL (ref 3.87–5.11)
RDW: 13.4 % (ref 11.5–15.5)
WBC: 11.1 10*3/uL — ABNORMAL HIGH (ref 4.0–10.5)

## 2023-08-10 LAB — COMPREHENSIVE METABOLIC PANEL
ALT: 10 U/L (ref 0–35)
AST: 12 U/L (ref 0–37)
Albumin: 4.1 g/dL (ref 3.5–5.2)
Alkaline Phosphatase: 38 U/L — ABNORMAL LOW (ref 39–117)
BUN: 12 mg/dL (ref 6–23)
CO2: 23 meq/L (ref 19–32)
Calcium: 8.9 mg/dL (ref 8.4–10.5)
Chloride: 106 meq/L (ref 96–112)
Creatinine, Ser: 0.73 mg/dL (ref 0.40–1.20)
GFR: 99.59 mL/min (ref >60.00)
Glucose, Bld: 75 mg/dL (ref 70–99)
Potassium: 3.7 meq/L (ref 3.5–5.1)
Sodium: 140 meq/L (ref 135–145)
Total Bilirubin: 0.4 mg/dL (ref 0.2–1.2)
Total Protein: 7.2 g/dL (ref 6.0–8.3)

## 2023-08-10 LAB — HIGH SENSITIVITY CRP: CRP, High Sensitivity: 7 mg/L — ABNORMAL HIGH (ref 0.000–5.000)

## 2023-08-10 MED ORDER — ONDANSETRON HCL 8 MG PO TABS: ORAL_TABLET | ORAL | 0 refills | Status: DC

## 2023-08-10 NOTE — Progress Notes (Signed)
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  She subsequently underwent upper endoscopy and colonoscopy (October 23, 2022), and MR enterography (October 27, 2022).  She was found to have left-sided colonic and ileal Crohn's disease.  She was placed on prednisone 40 mg daily.  She was subsequently seen in the office October 31, 2019 and doing better.  See that dictation.   She was then seen Nov 30, 2022.  At that time prednisone taper initiated.  Plans for vaccination hepatitis a and B as well as zoster made.  TPMT testing showed her to be a normal metabolizer.  She reviewed information on Humira and biologic therapy.  She has been in touch with the nurse educator for Humira and has her medication, but has not initiated therapy yet as she was waiting for that appointment.  At that time she was on 20 mg of prednisone daily and reported 1-2 mostly formed bowel movements per day without bleeding.   She was then seen January 15, 2023.  Prednisone was decreased to 10 mg daily.  Humira was to begin shortly.  6-MP prescribed.  Follow-up in a couple of months recommended.   Seems that she had intolerance to 6-MP with repeated episodes of nausea and vomiting abdominal pain while on the medication.  Thus, this was stopped.  She is continued on Humira.  She is undergoing a steroid taper.  Currently on 5 mg prednisone.  Occasional joint aches.  She did have a bout of abdominal cramping yesterday which lasted about 6 hours and was followed by vomiting and diarrhea.  Feels better today.   She was then seen May 08, 2023.  See that dictation.  At that time, recent blood work May 04, 2023 showed essentially normal comprehensive metabolic panel.  Normal lipase.  Normal C-reactive protein.  CBC normal except for mild leukocytosis (12.2).  She has no  other complaints at this time except for some easy bruisability.  No fevers.  She has gained 7 pounds since her last visit.  At that time it was determined that she was intolerant to 6-mercaptopurine.  She was continued on Humira 40 mg every 2 weeks.  She was continued on prednisone 5 mg daily for 2 weeks then told to stop.  Have prescribed Bentyl as needed for cramping abdominal pain.  Patient presents today for follow-up.  She is accompanied by her husband.  She contacted the office complaining of joint pains after coming off prednisone.  We recommended rheumatology evaluation.  She saw Deanna Alease Medina, PA-C at [facility] rheumatology November 2023 and most recently July 31, 2023.  At the time of her most recent visit she was given a prednisone taper starting with 20 mg daily for 1 week to be decreased by 5 mg each week for a total of 1 month treatment.  The joints are feeling bit better.  She also describes having intermittent problems with abdominal pain associated with vomiting.  Typically lasts 12 to 18 hours.  The abdominal discomfort is mid abdomen.  Bentyl seems to help.  No change in bowel movements or bleeding.  Her weight has been stable.  No blood work since May 04, 2023.      REVIEW OF SYSTEMS:  All non-GI ROS negative except for sinus and allergy trouble, arthritis, muscle cramps, headaches, sleeping problems  Past Medical History:  Diagnosis Date   Crohn's disease (HCC)    Hyperlipidemia    Hypertension    Migraines    Obesity    Vitamin D deficiency     Past Surgical History:  Procedure Laterality Date   APPENDECTOMY      Social History Vearl M Lard  reports that she has never smoked. She has never used smokeless tobacco. She reports that she does not drink alcohol and does not use drugs.  family history includes Atrial fibrillation in her mother; Breast cancer (age of onset: 83) in her mother; CAD in her father; Dementia in her mother; Depression in her  brother; Diabetes in her father and paternal grandmother; Hypertension in her maternal grandmother and mother; Kidney cancer in her mother.  No Known Allergies     PHYSICAL EXAMINATION: Vital signs: BP 122/70   Pulse 79   Ht 5\' 2"  (1.575 m)   Wt 201 lb (91.2 kg)   BMI 36.76 kg/m (up 4 pounds) Constitutional: generally well-appearing, no acute distress Psychiatric: alert and oriented x3, cooperative Eyes: extraocular movements intact, anicteric, conjunctiva pink Mouth: oral pharynx moist, no lesions Neck: supple no lymphadenopathy Cardiovascular: heart regular rate and rhythm, no murmur Lungs: clear to auscultation bilaterally Abdomen: soft, nontender, nondistended, no obvious ascites, no peritoneal signs, normal bowel sounds, no organomegaly Rectal: Omitted Extremities: no clubbing, cyanosis, or lower extremity edema bilaterally Skin: no lesions on visible extremities Neuro: No focal deficits.  Cranial nerves intact  ASSESSMENT:   1.  Crohn's disease involving the left colon and terminal ileum.  Currently on Humira 40 mg every 2 weeks.  She describes episodes of abdominal pain associated with vomiting.  This sounds like intermittent obstructive symptoms, from her ileal disease.  No problems over the past 3 weeks (on prednisone for joint aches).  Has been on Humira for 6 months.  Last colonoscopy October 23, 2022.  Last MR enterography October 27, 2022 2.  Arthralgias.  May or may not be associated with IBD.  Seeing rheumatology.  Placed on low-dose 4-week prednisone taper 3.  Intolerant to 6-mercaptopurine     PLAN:   1.  Continue prednisone taper started by rheumatology 2.  Continue Humira 40 mg every 2 weeks 3.  Continue Bentyl 20 mg p.o. every 4-6 hours as needed cramping pain 4.  Blood work today including CBC, comprehensive metabolic panel, C-reactive protein 5.  Schedule colonoscopy to assess Crohn's disease activity after being on Humira 6 months.The nature of the procedure,  as well as the risks, benefits, and alternatives were carefully and thoroughly reviewed with the patient. Ample time for discussion and questions allowed. The patient understood, was satisfied, and agreed to proceed.  She had problems with nausea and vomiting with her last prep.  PRESCRIBE Zofran 8 mg to be taken about 20 minutes prior to each prep session 6.  MR enterography thereafter 7.  We discussed the role of surgery for symptomatic fibrostenotic ileal disease. 8.  Office follow-up after the above completed, to be determined.   A total time of 45 minutes was spent preparing to see the patient, obtaining interval history, performing medically appropriate physical examination, counseling and educating the patient and her husband regarding the above listed issues, prescribing medication, ordering blood work, ordering colonoscopy, defining follow-up parameters, and documenting clinical information in the health record

## 2023-08-10 NOTE — Patient Instructions (Signed)
Your provider has requested that you go to the basement level for lab work before leaving today. Press "B" on the elevator. The lab is located at the first door on the left as you exit the elevator.  You have been scheduled for a colonoscopy. Please follow written instructions given to you at your visit today.   Please pick up your prep supplies at the pharmacy within the next 1-3 days.  If you use inhalers (even only as needed), please bring them with you on the day of your procedure.  DO NOT TAKE 7 DAYS PRIOR TO TEST- Trulicity (dulaglutide) Ozempic, Wegovy (semaglutide) Mounjaro (tirzepatide) Bydureon Bcise (exanatide extended release)  DO NOT TAKE 1 DAY PRIOR TO YOUR TEST Rybelsus (semaglutide) Adlyxin (lixisenatide) Victoza (liraglutide) Byetta (exanatide) ______________________________________________________________________

## 2023-08-21 ENCOUNTER — Encounter: Payer: Self-pay | Admitting: Physician Assistant

## 2023-08-22 ENCOUNTER — Telehealth: Payer: Self-pay | Admitting: Internal Medicine

## 2023-08-22 NOTE — Telephone Encounter (Signed)
 Patient Physical Therapy called and stated that Sharon Russo was seen by Merilee and he faxed over to our office an appointment  note. Patient PT stated that they would like you to increase her humira  to weekly dosage to help with her joints. A good call back number for them is [phone]. Please advise.

## 2023-08-22 NOTE — Telephone Encounter (Signed)
 We are going to reassess her Crohn's disease with colonoscopy in 2 weeks. At that time, we can decide if adjustment in Humira  is needed (if significant active Crohn's present). Of course, would need to check trough drug level and drug antibodies prior to any consideration of change. Dr. Abran

## 2023-08-22 NOTE — Telephone Encounter (Signed)
Detailed message left on voicemail for Alroy Bailiff CMA regarding Dr. Marina Goodell recommendations/plan.

## 2023-08-22 NOTE — Telephone Encounter (Signed)
 [facility] called, pt was seen by Mable Saver PA yesterday. Pt is having increased joint pain and PA is requesting/suggesting pts Humira  be increased to weekly to help with her joints. Please advise.

## 2023-08-22 NOTE — Telephone Encounter (Signed)
 Left message for pt to call back

## 2023-08-30 ENCOUNTER — Encounter: Payer: Self-pay | Admitting: Internal Medicine

## 2023-09-07 ENCOUNTER — Ambulatory Visit: Payer: 59 | Admitting: Internal Medicine

## 2023-09-07 ENCOUNTER — Encounter: Payer: Self-pay | Admitting: Internal Medicine

## 2023-09-07 VITALS — BP 134/84 | HR 76 | Temp 97.8°F | Resp 15 | Ht 62.0 in | Wt 201.0 lb

## 2023-09-07 DIAGNOSIS — K56699 Other intestinal obstruction unspecified as to partial versus complete obstruction: Secondary | ICD-10-CM

## 2023-09-07 DIAGNOSIS — K5140 Inflammatory polyps of colon without complications: Secondary | ICD-10-CM

## 2023-09-07 DIAGNOSIS — K5080 Crohn's disease of both small and large intestine without complications: Secondary | ICD-10-CM

## 2023-09-07 MED ORDER — SODIUM CHLORIDE 0.9 % IV SOLN: 500.0000 mL | Freq: Once | INTRAVENOUS | Status: DC

## 2023-09-07 NOTE — Patient Instructions (Signed)

## 2023-09-07 NOTE — Progress Notes (Signed)
Expand All Collapse All HISTORY OF PRESENT ILLNESS:   Sharon Russo is a 43 y.o. 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  She subsequently underwent upper endoscopy and colonoscopy (October 23, 2022), and MR enterography (October 27, 2022).  She was found to have left-sided colonic and ileal Crohn's disease.  She was placed on prednisone 40 mg daily.  She was subsequently seen in the office October 31, 2019 and doing better.  See that dictation.   She was then seen Nov 30, 2022.  At that time prednisone taper initiated.  Plans for vaccination hepatitis a and B as well as zoster made.  TPMT testing showed her to be a normal metabolizer.  She reviewed information on Humira and biologic therapy.  She has been in touch with the nurse educator for Humira and has her medication, but has not initiated therapy yet as she was waiting for that appointment.  At that time she was on 20 mg of prednisone daily and reported 1-2 mostly formed bowel movements per day without bleeding.   She was then seen January 15, 2023.  Prednisone was decreased to 10 mg daily.  Humira was to begin shortly.  6-MP prescribed.  Follow-up in a couple of months recommended.   Seems that she had intolerance to 6-MP with repeated episodes of nausea and vomiting abdominal pain while on the medication.  Thus, this was stopped.  She is continued on Humira.  She is undergoing a steroid taper.  Currently on 5 mg prednisone.  Occasional joint aches.  She did have a bout of abdominal cramping yesterday which lasted about 6 hours and was followed by vomiting and diarrhea.  Feels better today.   She was then seen May 08, 2023.  See that dictation.  At that time, recent blood work May 04, 2023 showed essentially normal comprehensive metabolic panel.  Normal lipase.  Normal C-reactive protein.  CBC normal except for mild  leukocytosis (12.2).  She has no other complaints at this time except for some easy bruisability.  No fevers.  She has gained 7 pounds since her last visit.  At that time it was determined that she was intolerant to 6-mercaptopurine.  She was continued on Humira 40 mg every 2 weeks.  She was continued on prednisone 5 mg daily for 2 weeks then told to stop.  Have prescribed Bentyl as needed for cramping abdominal pain.   Patient presents today for follow-up.  She is accompanied by her husband.  She contacted the office complaining of joint pains after coming off prednisone.  We recommended rheumatology evaluation.  She saw Deanna Alease Medina, PA-C at [facility] rheumatology November 2023 and most recently July 31, 2023.  At the time of her most recent visit she was given a prednisone taper starting with 20 mg daily for 1 week to be decreased by 5 mg each week for a total of 1 month treatment.  The joints are feeling bit better.  She also describes having intermittent problems with abdominal pain associated with vomiting.  Typically lasts 12 to 18 hours.  The abdominal discomfort is mid abdomen.  Bentyl seems to help.  No change in bowel movements or bleeding.  Her weight has been stable.  No blood work since May 04, 2023.         REVIEW OF SYSTEMS:   All non-GI ROS negative except for sinus  and allergy trouble, arthritis, muscle cramps, headaches, sleeping problems       Past Medical History:  Diagnosis Date   Crohn's disease (HCC)     Hyperlipidemia     Hypertension     Migraines     Obesity     Vitamin D deficiency                 Past Surgical History:  Procedure Laterality Date   APPENDECTOMY              Social History Kylena M Cherubin  reports that she has never smoked. She has never used smokeless tobacco. She reports that she does not drink alcohol and does not use drugs.   family history includes Atrial fibrillation in her mother; Breast cancer (age of onset: 51)  in her mother; CAD in her father; Dementia in her mother; Depression in her brother; Diabetes in her father and paternal grandmother; Hypertension in her maternal grandmother and mother; Kidney cancer in her mother.   Allergies  No Known Allergies         PHYSICAL EXAMINATION: Vital signs: BP 122/70   Pulse 79   Ht 5\' 2"  (1.575 m)   Wt 201 lb (91.2 kg)   BMI 36.76 kg/m (up 4 pounds) Constitutional: generally well-appearing, no acute distress Psychiatric: alert and oriented x3, cooperative Eyes: extraocular movements intact, anicteric, conjunctiva pink Mouth: oral pharynx moist, no lesions Neck: supple no lymphadenopathy Cardiovascular: heart regular rate and rhythm, no murmur Lungs: clear to auscultation bilaterally Abdomen: soft, nontender, nondistended, no obvious ascites, no peritoneal signs, normal bowel sounds, no organomegaly Rectal: Omitted Extremities: no clubbing, cyanosis, or lower extremity edema bilaterally Skin: no lesions on visible extremities Neuro: No focal deficits.  Cranial nerves intact   ASSESSMENT:   1.  Crohn's disease involving the left colon and terminal ileum.  Currently on Humira 40 mg every 2 weeks.  She describes episodes of abdominal pain associated with vomiting.  This sounds like intermittent obstructive symptoms, from her ileal disease.  No problems over the past 3 weeks (on prednisone for joint aches).  Has been on Humira for 6 months.  Last colonoscopy October 23, 2022.  Last MR enterography October 27, 2022 2.  Arthralgias.  May or may not be associated with IBD.  Seeing rheumatology.  Placed on low-dose 4-week prednisone taper 3.  Intolerant to 6-mercaptopurine     PLAN:   1.  Continue prednisone taper started by rheumatology 2.  Continue Humira 40 mg every 2 weeks 3.  Continue Bentyl 20 mg p.o. every 4-6 hours as needed cramping pain 4.  Blood work today including CBC, comprehensive metabolic panel, C-reactive protein 5.  Schedule colonoscopy  to assess Crohn's disease activity after being on Humira 6 months.The nature of the procedure, as well as the risks, benefits, and alternatives were carefully and thoroughly reviewed with the patient. Ample time for discussion and questions allowed. The patient understood, was satisfied, and agreed to proceed.  She had problems with nausea and vomiting with her last prep.  PRESCRIBE Zofran 8 mg to be taken about 20 minutes prior to each prep session 6.  MR enterography thereafter 7.  We discussed the role of surgery for symptomatic fibrostenotic ileal disease. 8.  Office follow-up after the above completed, to be determined.

## 2023-09-07 NOTE — Op Note (Signed)
[facility] Patient Name: Sharon Russo Procedure Date: 09/07/2023 11:06 AM MRN: [id number] Endoscopist: Wilhemina Bonito. Marina Goodell , MD, [id number] Age: 43 Referring MD:  Date of Birth: [birth year] Gender: Female Account #: [id number] Procedure:                Colonoscopy Indications:              Assess therapeutic response to therapy of Crohn's                            disease of the small bowel and colon. Diagnosed                            April 2024. Has been on Humira 6+ months Medicines:                Monitored Anesthesia Care Procedure:                Pre-Anesthesia Assessment:                           - Prior to the procedure, a History and Physical                            was performed, and patient medications and                            allergies were reviewed. The patient's tolerance of                            previous anesthesia was also reviewed. The risks                            and benefits of the procedure and the sedation                            options and risks were discussed with the patient.                            All questions were answered, and informed consent                            was obtained. Prior Anticoagulants: The patient has                            taken no anticoagulant or antiplatelet agents. ASA                            Grade Assessment: II - A patient with mild systemic                            disease. After reviewing the risks and benefits,                            the patient was deemed in satisfactory condition to  undergo the procedure.                           After obtaining informed consent, the colonoscope                            was passed under direct vision. Throughout the                            procedure, the patient's blood pressure, pulse, and                            oxygen saturations were monitored continuously. The                            CF [id number] #[id number]  was introduced through the anus                            and advanced to the the cecum, identified by                            appendiceal orifice and ileocecal valve. The                            terminal ileum, ileocecal valve, appendiceal                            orifice, and rectum were photographed. The quality                            of the bowel preparation was excellent. The                            colonoscopy was performed without difficulty. The                            patient tolerated the procedure well. The bowel                            preparation used was SUPREP via split dose                            instruction. Scope In: 11:15:05 AM Scope Out: 11:26:21 AM Scope Withdrawal Time: 0 hours 8 minutes 7 seconds  Total Procedure Duration: 0 hours 11 minutes 16 seconds  Findings:                 The terminal ileum was stenosed, preventing deep                            intubation. No obvious ulceration.                           The left colon revealed scarring, loss of haustral  pattern, and small pseudopolyposis in the region of                            previous active Crohn's disease (20-45 cm). The                            remainder of the colon was normal. Complications:            No immediate complications. Estimated blood loss:                            None. Estimated Blood Loss:     Estimated blood loss: none. Impression:               1. Ileal stenosis without obvious active                            inflammation. Deep intubation not achieved.                           2. No active Crohn's disease of the colon. Scarring                            in the left colon with pseudopolyposis as described.                           3. Otherwise normal exam Recommendation:           - Repeat colonoscopy (date not yet determined) for                            surveillance.                           - Patient has a  contact number available for                            emergencies. The signs and symptoms of potential                            delayed complications were discussed with the                            patient. Return to normal activities tomorrow.                            Written discharge instructions were provided to the                            patient.                           - Resume previous diet.                           - Continue present medications.                           -  PLEASE schedule MR enterography "assess small                            bowel Crohn's".                           - Office follow-up with Dr. Marina Goodell in 3 months Wilhemina Bonito. Marina Goodell, MD 09/07/2023 11:38:30 AM This report has been signed electronically.

## 2023-09-07 NOTE — Progress Notes (Signed)
 Pt's states no medical or surgical changes since previsit or office visit.

## 2023-09-07 NOTE — Progress Notes (Signed)
 Report to PACU, RN, vss, BBS= Clear.

## 2023-09-10 ENCOUNTER — Telehealth: Payer: Self-pay

## 2023-09-10 NOTE — Telephone Encounter (Signed)
 Left message on answering machine.

## 2023-09-11 ENCOUNTER — Other Ambulatory Visit: Payer: Self-pay

## 2023-09-14 ENCOUNTER — Other Ambulatory Visit: Payer: Self-pay

## 2023-09-14 DIAGNOSIS — K5080 Crohn's disease of both small and large intestine without complications: Secondary | ICD-10-CM

## 2023-09-19 ENCOUNTER — Other Ambulatory Visit: Payer: Self-pay

## 2023-09-19 DIAGNOSIS — K5080 Crohn's disease of both small and large intestine without complications: Secondary | ICD-10-CM

## 2023-09-21 ENCOUNTER — Ambulatory Visit (HOSPITAL_COMMUNITY)
Admission: RE | Admit: 2023-09-21 | Discharge: 2023-09-21 | Disposition: A | Discharge status: Home / Self Care | Source: Ambulatory Visit | Attending: Internal Medicine | Admitting: Internal Medicine

## 2023-09-21 DIAGNOSIS — K5080 Crohn's disease of both small and large intestine without complications: Secondary | ICD-10-CM | POA: Insufficient documentation

## 2023-09-21 MED ORDER — GADOBUTROL 1 MMOL/ML IV SOLN
9.0000 mL | Freq: Once | INTRAVENOUS | Status: AC | PRN
  Administered 2023-09-21: 9 mL via INTRAVENOUS

## 2023-09-24 ENCOUNTER — Encounter: Payer: Self-pay | Admitting: Internal Medicine

## 2023-09-24 ENCOUNTER — Other Ambulatory Visit: Payer: Self-pay

## 2023-09-24 DIAGNOSIS — K5080 Crohn's disease of both small and large intestine without complications: Secondary | ICD-10-CM

## 2023-09-28 ENCOUNTER — Other Ambulatory Visit

## 2023-09-28 DIAGNOSIS — K5080 Crohn's disease of both small and large intestine without complications: Secondary | ICD-10-CM

## 2023-10-11 ENCOUNTER — Encounter: Payer: Self-pay | Admitting: Internal Medicine

## 2023-10-11 LAB — ADALIMUMAB+AB (SERIAL MONITOR): Adalimumab Drug Level: 12 ug/mL

## 2023-12-13 ENCOUNTER — Encounter: Payer: Self-pay | Admitting: Internal Medicine

## 2023-12-13 ENCOUNTER — Ambulatory Visit: Payer: 59 | Admitting: Internal Medicine

## 2023-12-13 VITALS — BP 124/80 | HR 71 | Ht 62.0 in | Wt 203.0 lb

## 2023-12-13 DIAGNOSIS — R1110 Vomiting, unspecified: Secondary | ICD-10-CM | POA: Diagnosis not present

## 2023-12-13 DIAGNOSIS — R109 Unspecified abdominal pain: Secondary | ICD-10-CM | POA: Diagnosis not present

## 2023-12-13 DIAGNOSIS — R935 Abnormal findings on diagnostic imaging of other abdominal regions, including retroperitoneum: Secondary | ICD-10-CM

## 2023-12-13 DIAGNOSIS — M2550 Pain in unspecified joint: Secondary | ICD-10-CM | POA: Diagnosis not present

## 2023-12-13 DIAGNOSIS — K5080 Crohn's disease of both small and large intestine without complications: Secondary | ICD-10-CM | POA: Diagnosis not present

## 2023-12-13 NOTE — Progress Notes (Signed)
 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  She subsequently underwent upper endoscopy and colonoscopy (October 23, 2022), and MR enterography (October 27, 2022).  She was found to have left-sided colonic and ileal Crohn's disease.  She was placed on prednisone  40 mg daily.  She was subsequently seen in the office October 31, 2022 and doing better.  See that dictation.   She was then seen Nov 30, 2022.  At that time prednisone  taper initiated.  Plans for vaccination hepatitis A and B as well as zoster made.  TPMT testing showed her to be a normal metabolizer.  She reviewed information on Humira  and biologic therapy.  She has been in touch with the nurse educator for Humira  and has her medication, but has not initiated therapy yet as she was waiting for that appointment.  At that time she was on 20 mg of prednisone  daily and reported 1-2 mostly formed bowel movements per day without bleeding.   She was then seen January 15, 2023.  Prednisone  was decreased to 10 mg daily.  Humira  was to begin shortly.  6-MP prescribed.  Follow-up in a couple of months recommended.   Seems that she had intolerance to 6-MP with repeated episodes of nausea and vomiting abdominal pain while on the medication.  Thus, this was stopped.  She is continued on Humira .  She is undergoing a steroid taper.  Currently on 5 mg prednisone .  Occasional joint aches.  She did have a bout of abdominal cramping yesterday which lasted about 6 hours and was followed by vomiting and diarrhea.  Feels better today.   She was then seen May 08, 2023.  See that dictation.  At that time, recent blood work May 04, 2023 showed essentially normal comprehensive metabolic panel.  Normal lipase.  Normal C-reactive protein.  CBC normal except for mild leukocytosis (12.2).   She has no other complaints at this time except for some easy bruisability.  No fevers.  She has gained 7 pounds since her last visit.  At that time it was determined that she was intolerant to 6-mercaptopurine .  She was continued on Humira  40 mg every 2 weeks.  She was continued on prednisone  5 mg daily for 2 weeks then told to stop.  Have prescribed Bentyl  as needed for cramping abdominal pain.   The patient was then seen in the office August 10, 2023. She was accompanied by her husband.  She had contacted the office prior complaining of joint pains after coming off prednisone .  We recommended rheumatology evaluation.  She saw Deanna Mable Saver, PA-C at [facility] rheumatology November 2023 and most recently July 31, 2023.  At the time of her most recent visit she was given a prednisone  taper starting with 20 mg daily for 1 week to be decreased by 5 mg each week for a total of 1 month treatment.  The joints are feeling bit better.  She also describes having intermittent problems with abdominal pain associated with vomiting.  Typically lasts 12 to 18 hours.  The abdominal discomfort is mid abdomen.  Bentyl  seems to help.  No change in bowel movements or bleeding.  Her weight has been stable.  No blood work since May 04, 2023.  She was tapered off prednisone  that month.  Continued on Humira .  We discussed the role of  surgery.  Blood work that day showed a normal comprehensive metabolic panel.  Mildly elevated CRP at 7.0.  Normal CBC with hemoglobin 14.1.  She underwent colonoscopy September 07, 2023 to assess her Crohn's disease.  The terminal ileum was stenotic, preventing deep intubation and mucosal assessment.  Previous colonic Crohn's disease related ulceration had healed with residual scarring and pseudopolyposis noted in the left colon as described.  MR enterography was performed September 21, 2023.  She was found to have increased thickening and mucosal enhancement of a tethered appearing  terminal ileum measuring approximately 10 cm in length.  In addition, she was noted to have stricturing of the involved segment with proximal distention and fecalization.  Possible dilation measuring up to 4.7 cm in caliber.  No evidence for complicating features such as fistula or abscess.  On September 28, 2023 adalimumab  drug and antibody levels were obtained.  Drug levels were quite adequate at 12.  Antibody levels were negative.  She presents today for follow-up.  She tells me that she has not had prednisone  since January.  She tells me that her rheumatologist was hoping that I would increase her biologic therapy in hopes of helping her joint pain.  She was placed on methotrexate by rheumatology a few weeks ago.  Tells me that over this past weekend she had a recurrent episode of abdominal pain associated with vomiting.  Transient loose stools.  This lasted about 3 days.  Now feeling a bit better.  No additional complaints today.  I have reviewed with her today the results from colonoscopy, MR enterography, and drug testing.  REVIEW OF SYSTEMS:  All non-GI ROS negative except for arthralgias in the arms  Past Medical History:  Diagnosis Date   Crohn's disease (HCC)    Hyperlipidemia    Hypertension    Migraines    Obesity    Vitamin D deficiency     Past Surgical History:  Procedure Laterality Date   APPENDECTOMY      Social History Sharon Russo  reports that she has never smoked. She has never used smokeless tobacco. She reports that she does not drink alcohol  and does not use drugs.  family history includes Atrial fibrillation in her mother; Breast cancer (age of onset: 70) in her mother; CAD in her father; Dementia in her mother; Depression in her brother; Diabetes in her father and paternal grandmother; Hypertension in her maternal grandmother and mother; Kidney cancer in her mother.  No Known Allergies     PHYSICAL EXAMINATION: Vital signs: BP 124/80   Pulse 71   Ht 5'  2" (1.575 m)   Wt 203 lb (92.1 kg)   BMI 37.13 kg/m   Constitutional: generally well-appearing, no acute distress Psychiatric: alert and oriented x3, cooperative Eyes: extraocular movements intact, anicteric, conjunctiva pink Mouth: oral pharynx moist, no lesions Neck: supple no lymphadenopathy Cardiovascular: heart regular rate and rhythm, no murmur Lungs: clear to auscultation bilaterally Abdomen: soft, obese, nontender, nondistended, no obvious ascites, no peritoneal signs, normal bowel sounds, no organomegaly Rectal: Omitted Extremities: no clubbing, cyanosis, or lower extremity edema bilaterally Skin: no lesions on visible extremities Neuro: No focal deficits.  Cranial nerves intact  ASSESSMENT:   1.  Crohn's disease involving the left colon and terminal ileum.  Currently on Humira  40 mg every 2 weeks.  Recent colonoscopy revealed healing of colonic disease and stenosis of the ileum.  Recent MR enterography revealed 10 cm segment of terminal ileal disease with upstream dilation consistent with 6  fibrosis.  Normal adalimumab  drug and antibody level testing.  She describes episodes of abdominal pain associated with vomiting.  This certainly sounds like intermittent obstructive symptoms, from her ileal disease.   2.  Arthralgias.  May or may not be associated with IBD.  Seeing rheumatology.  Recently placed on methotrexate 3.  Intolerant to 6-mercaptopurine      PLAN:   1.  At this point, I feel that [place] would benefit from surgical intervention for her Crohn's disease--as I do not believe that this element of thick stenosis will respond adequately to medical therapies.  I would imagine ileocecectomy with resumption of biologic therapy postoperatively.  I am referring her to Dr. Aretta Beery, colorectal and IBD surgical specialist, at [facility]. 2.  Continue Humira  at current dose 3.  Management of arthralgias per rheumatology 4.  Await Dr. Karle Ovens assessment.    A total time  of 40 minutes was spent preparing to see the patient, obtaining interval history, performing medically appropriate physical examination, counseling and educating the patient and her husband regarding the above listed issues, prescribing medication, ordering blood work, ordering colonoscopy, defining follow-up parameters, and documenting clinical information in the health record

## 2023-12-13 NOTE — Patient Instructions (Signed)
 I will refer you to Aretta Beery at [facility] - they will call you to set up an appointment.  _______________________________________________________  If your blood pressure at your visit was 140/90 or greater, please contact your primary care physician to follow up on this.  _______________________________________________________  If you are age 45 or older, your body mass index should be between 23-30. Your Body mass index is 37.13 kg/m. If this is out of the aforementioned range listed, please consider follow up with your Primary Care Provider.  If you are age 36 or younger, your body mass index should be between 19-25. Your Body mass index is 37.13 kg/m. If this is out of the aformentioned range listed, please consider follow up with your Primary Care Provider.   ________________________________________________________  The [facility] providers would like to encourage you to use MYCHART to communicate with providers for non-urgent requests or questions.  Due to long hold times on the telephone, sending your provider a message by [facility] may be a faster and more efficient way to get a response.  Please allow 48 business hours for a response.  Please remember that this is for non-urgent requests.  _______________________________________________________

## 2023-12-14 ENCOUNTER — Telehealth: Payer: Self-pay

## 2023-12-14 NOTE — Telephone Encounter (Signed)
 Surgical referral faxed to Aretta Beery

## 2023-12-19 ENCOUNTER — Other Ambulatory Visit: Payer: Self-pay | Admitting: Obstetrics & Gynecology

## 2023-12-19 DIAGNOSIS — Z3041 Encounter for surveillance of contraceptive pills: Secondary | ICD-10-CM

## 2023-12-19 NOTE — Telephone Encounter (Signed)
 Med refill request: Loestrin 24 FE, continuous method Last AEX: 01/25/23 Next AEX: 07/14 25 Last MMG (if hormonal med) 02/27/23 BI-RADS 1 negative Refill authorized: Loestrin 24 FE, continuous method.  Please Advise?

## 2023-12-21 ENCOUNTER — Other Ambulatory Visit: Payer: Self-pay | Admitting: Internal Medicine

## 2023-12-21 NOTE — Telephone Encounter (Signed)
 PT is calling to get update on the surgical referral that was sent out for her. She hasn't heard anything from them. Please advise.

## 2023-12-24 NOTE — Telephone Encounter (Signed)
 Spoke with Dr. Karle Ovens office - patient is scheduled for 7/16 and is aware of the appointment

## 2024-01-25 NOTE — Progress Notes (Signed)
 43 y.o. G22P1001 female on COC with Crohn's disease (diagnosed 2024), HTN, HLD here for annual exam. Married.  84 year old son.  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  No LMP recorded. (Menstrual status: Oral contraceptives).   She reports no other concerns today.  Planning to see colorectal surgeon next week for possible small bowel resection due to Crohn's disease. On COC for menstrual migraine, taking continuously.  Unsure if this has been helpful, as she has had more frequent migraines recently. She is not on any migraine medication, and is using over-the-counter meds. Urine sample provided: No  Abnormal bleeding: None Pelvic discharge or pain: None Breast mass, nipple discharge or skin changes : None  Sexually active: Yes Birth control: OCP Last PAP:     Component Value Date/Time   DIAGPAP  02/07/2021 1633    - Negative for intraepithelial lesion or malignancy (NILM)   ADEQPAP  02/07/2021 1633    Satisfactory for evaluation; transformation zone component PRESENT.   Last mammogram: 02/27/23 Bi-Rads 1 Last colonoscopy: 09/07/23 5 year recall  Exercising: Yes, walking 3 days a week Smoker: No  Flowsheet Row Office Visit from 01/28/2024 in [facility]  PHQ-2 Total Score 0      GYN HISTORY: No significant history  OB History  Gravida Para Term Preterm AB Living  1 1 1   0 1  SAB IAB Ectopic Multiple Live Births    0  1    # Outcome Date GA Lbr Len/2nd Weight Sex Type Anes PTL Lv  1 Term      Vag-Spont   LIV   Past Medical History:  Diagnosis Date   Crohn's disease (HCC)    Hyperlipidemia    Hypertension    Migraines    Obesity    Vitamin D deficiency    Past Surgical History:  Procedure Laterality Date   APPENDECTOMY     Current Outpatient Medications on File Prior to Visit  Medication Sig Dispense Refill   Adalimumab  (HUMIRA -CD/UC/HS STARTER) 80 MG/0.8ML PNKT Inject 160mg  day 1 Inject 80mg  day 15 3 each 0   folic acid  (FOLVITE) 1 MG tablet Take 1 mg by mouth daily.     HUMIRA , 2 PEN, 40 MG/0.4ML pen INJECT 1 PEN SUBCUTANEOUSLY  EVERY OTHER WEEK 2 each 11   losartan (COZAAR) 50 MG tablet Take 1 tablet by mouth daily.  5   methotrexate 50 MG/2ML injection Inject 20 mg into the skin once a week.     ondansetron  (ZOFRAN ) 4 MG tablet Take 1 tablet by mouth every 4-6 hours as needed for nausea and vomiting. 60 tablet 2   rosuvastatin (CRESTOR) 10 MG tablet Take 10 mg by mouth daily.     No current facility-administered medications on file prior to visit.   Social History   Socioeconomic History   Marital status: Married    Spouse name: Not on file   Number of children: 1   Years of education: Not on file   Highest education level: Not on file  Occupational History   Occupation: teacher  Tobacco Use   Smoking status: Never   Smokeless tobacco: Never  Vaping Use   Vaping status: Never Used  Substance and Sexual Activity   Alcohol  use: No   Drug use: No   Sexual activity: Yes    Partners: Male    Birth control/protection: Pill    Comment: intercourse age 61, less than 5 sexual partners, married- 17 yrs  Other Topics Concern  Not on file  Social History Narrative   Not on file   Social Drivers of Health   Financial Resource Strain: Not on file  Food Insecurity: Not on file  Transportation Needs: Not on file  Physical Activity: Not on file  Stress: Not on file  Social Connections: Not on file  Intimate Partner Violence: Not on file   Family History  Problem Relation Age of Onset   Hypertension Mother    Atrial fibrillation Mother    Dementia Mother    Kidney cancer Mother    Breast cancer Mother 64   CAD Father    Diabetes Father    Depression Brother    Hypertension Maternal Grandmother    Diabetes Paternal Grandmother    Colon cancer Neg Hx    Stomach cancer Neg Hx    Esophageal cancer Neg Hx    Rectal cancer Neg Hx    No Known Allergies   PE Today's Vitals   01/28/24  1120  BP: 118/72  Pulse: 76  Temp: 98.2 F (36.8 C)  TempSrc: Oral  SpO2: 98%  Weight: 203 lb (92.1 kg)  Height: 5' 3.25 (1.607 m)   Body mass index is 35.68 kg/m.  Physical Exam Vitals reviewed. Exam conducted with a chaperone present.  Constitutional:      General: She is not in acute distress.    Appearance: Normal appearance.  HENT:     Head: Normocephalic and atraumatic.     Nose: Nose normal.  Eyes:     Extraocular Movements: Extraocular movements intact.     Conjunctiva/sclera: Conjunctivae normal.  Neck:     Thyroid : No thyroid  mass, thyromegaly or thyroid  tenderness.  Pulmonary:     Effort: Pulmonary effort is normal.  Chest:     Chest wall: No mass or tenderness.  Breasts:    Right: Normal. No swelling, mass, nipple discharge, skin change or tenderness.     Left: Normal. No swelling, mass, nipple discharge, skin change or tenderness.  Abdominal:     General: There is no distension.     Palpations: Abdomen is soft.     Tenderness: There is no abdominal tenderness.  Genitourinary:    General: Normal vulva.     Exam position: Lithotomy position.     Urethra: No prolapse.     Vagina: Normal. No vaginal discharge or bleeding.     Cervix: Normal. No lesion.     Uterus: Normal. Not enlarged and not tender.      Adnexa: Right adnexa normal and left adnexa normal.  Musculoskeletal:        General: Normal range of motion.     Cervical back: Normal range of motion.  Lymphadenopathy:     Upper Body:     Right upper body: No axillary adenopathy.     Left upper body: No axillary adenopathy.     Lower Body: No right inguinal adenopathy. No left inguinal adenopathy.  Skin:    General: Skin is warm and dry.  Neurological:     General: No focal deficit present.     Mental Status: She is alert.  Psychiatric:        Mood and Affect: Mood normal.        Behavior: Behavior normal.      Assessment and Plan:        Well woman exam with routine gynecological  exam Assessment & Plan: Cervical cancer screening performed according to ASCCP guidelines. Encouraged annual mammogram screening Colonoscopy UTD Labs and  immunizations with her primary Encouraged safe sexual practices as indicated Encouraged healthy lifestyle practices with diet and exercise For patients under 50yo, I recommend 1000mg  calcium daily and 600IU of vitamin D daily.    Cervical cancer screening -     Cytology - PAP  Negative depression screening  Encounter for surveillance of contraceptive pills Assessment & Plan: Recommend switching to POP given diagnosis of hypertension on blood pressure medication for more than 1 year. Reviewed breakthrough bleeding is more common.  To follow-up if headaches worsen, could consider Lo lo-estrin or HRT.  Orders: -     Norethindrone ; Take 1 tablet (0.35 mg total) by mouth daily.  Dispense: 84 tablet; Refill: 4   Vera LULLA Pa, MD

## 2024-01-28 ENCOUNTER — Encounter: Payer: Self-pay | Admitting: Obstetrics and Gynecology

## 2024-01-28 ENCOUNTER — Other Ambulatory Visit (HOSPITAL_COMMUNITY)
Admission: RE | Admit: 2024-01-28 | Discharge: 2024-01-28 | Disposition: A | Discharge status: Home / Self Care | Source: Ambulatory Visit | Attending: Obstetrics and Gynecology | Admitting: Obstetrics and Gynecology

## 2024-01-28 ENCOUNTER — Ambulatory Visit (INDEPENDENT_AMBULATORY_CARE_PROVIDER_SITE_OTHER): Admitting: Obstetrics and Gynecology

## 2024-01-28 VITALS — BP 118/72 | HR 76 | Temp 98.2°F | Ht 63.25 in | Wt 203.0 lb

## 2024-01-28 DIAGNOSIS — Z1331 Encounter for screening for depression: Secondary | ICD-10-CM

## 2024-01-28 DIAGNOSIS — Z3041 Encounter for surveillance of contraceptive pills: Secondary | ICD-10-CM | POA: Insufficient documentation

## 2024-01-28 DIAGNOSIS — Z01419 Encounter for gynecological examination (general) (routine) without abnormal findings: Secondary | ICD-10-CM | POA: Insufficient documentation

## 2024-01-28 DIAGNOSIS — Z124 Encounter for screening for malignant neoplasm of cervix: Secondary | ICD-10-CM | POA: Diagnosis present

## 2024-01-28 MED ORDER — NORETHINDRONE 0.35 MG PO TABS: 1.0000 | ORAL_TABLET | Freq: Every day | ORAL | 4 refills | Status: AC

## 2024-01-28 NOTE — Assessment & Plan Note (Signed)
Cervical cancer screening performed according to ASCCP guidelines. Encouraged annual mammogram screening Colonoscopy UTD Labs and immunizations with her primary Encouraged safe sexual practices as indicated Encouraged healthy lifestyle practices with diet and exercise For patients under 45yo, I recommend 1000mg  calcium daily and 600IU of vitamin D daily.

## 2024-01-28 NOTE — Assessment & Plan Note (Signed)
 Recommend switching to POP given diagnosis of hypertension on blood pressure medication for more than 1 year. Reviewed breakthrough bleeding is more common.  To follow-up if headaches worsen, could consider Lo lo-estrin or HRT.

## 2024-01-28 NOTE — Patient Instructions (Signed)

## 2024-01-31 LAB — CYTOLOGY - PAP
Comment: NEGATIVE
Diagnosis: NEGATIVE
High risk HPV: NEGATIVE

## 2024-02-01 ENCOUNTER — Ambulatory Visit: Payer: Self-pay | Admitting: Obstetrics and Gynecology

## 2024-03-31 ENCOUNTER — Telehealth: Payer: Self-pay | Admitting: Internal Medicine

## 2024-03-31 NOTE — Telephone Encounter (Signed)
 Inbound call from patient wanting to specifically speak to you in regards to being scheduled for a follow up after her procedure even though I scheduled her with a PA since Dr.Perry has no availability. Patient requesting a call back since she stated if she had any concerns she can speak to you. Patient did not want to disclose any concerns she was having with me. Please advise  Thank you

## 2024-04-03 NOTE — Telephone Encounter (Signed)
 Spoke with patient and was able to find an appointment with Dr. Abran in October.

## 2024-04-16 ENCOUNTER — Encounter: Payer: Self-pay | Admitting: Internal Medicine

## 2024-04-16 ENCOUNTER — Other Ambulatory Visit (INDEPENDENT_AMBULATORY_CARE_PROVIDER_SITE_OTHER)

## 2024-04-16 ENCOUNTER — Ambulatory Visit: Admitting: Internal Medicine

## 2024-04-16 ENCOUNTER — Ambulatory Visit: Payer: Self-pay | Admitting: Internal Medicine

## 2024-04-16 VITALS — BP 124/80 | HR 77 | Ht 63.25 in | Wt 199.0 lb

## 2024-04-16 DIAGNOSIS — K5080 Crohn's disease of both small and large intestine without complications: Secondary | ICD-10-CM

## 2024-04-16 DIAGNOSIS — M2550 Pain in unspecified joint: Secondary | ICD-10-CM | POA: Diagnosis not present

## 2024-04-16 LAB — COMPREHENSIVE METABOLIC PANEL WITH GFR
ALT: 55 U/L — ABNORMAL HIGH (ref 0–35)
AST: 45 U/L — ABNORMAL HIGH (ref 0–37)
Albumin: 4.2 g/dL (ref 3.5–5.2)
Alkaline Phosphatase: 53 U/L (ref 39–117)
BUN: 13 mg/dL (ref 6–23)
CO2: 26 meq/L (ref 19–32)
Calcium: 9.5 mg/dL (ref 8.4–10.5)
Chloride: 106 meq/L (ref 96–112)
Creatinine, Ser: 0.74 mg/dL (ref 0.40–1.20)
GFR: 97.51 mL/min (ref >60.00)
Glucose, Bld: 78 mg/dL (ref 70–99)
Potassium: 4.2 meq/L (ref 3.5–5.1)
Sodium: 140 meq/L (ref 135–145)
Total Bilirubin: 0.3 mg/dL (ref 0.2–1.2)
Total Protein: 7.1 g/dL (ref 6.0–8.3)

## 2024-04-16 LAB — CBC WITH DIFFERENTIAL/PLATELET
Basophils Absolute: 0.1 K/uL (ref 0.0–0.1)
Basophils Relative: 1 % (ref 0.0–3.0)
Eosinophils Absolute: 0.2 K/uL (ref 0.0–0.7)
Eosinophils Relative: 2.9 % (ref 0.0–5.0)
HCT: 37 % (ref 36.0–46.0)
Hemoglobin: 11.9 g/dL — ABNORMAL LOW (ref 12.0–15.0)
Lymphocytes Relative: 39.4 % (ref 12.0–46.0)
Lymphs Abs: 2.8 K/uL (ref 0.7–4.0)
MCHC: 32.3 g/dL (ref 30.0–36.0)
MCV: 88.5 fl (ref 78.0–100.0)
Monocytes Absolute: 0.6 K/uL (ref 0.1–1.0)
Monocytes Relative: 8 % (ref 3.0–12.0)
Neutro Abs: 3.5 K/uL (ref 1.4–7.7)
Neutrophils Relative %: 48.7 % (ref 43.0–77.0)
Platelets: 303 K/uL (ref 150.0–400.0)
RBC: 4.18 Mil/uL (ref 3.87–5.11)
RDW: 14.4 % (ref 11.5–15.5)
WBC: 7.2 K/uL (ref 4.0–10.5)

## 2024-04-16 LAB — SEDIMENTATION RATE: Sed Rate: 12 mm/h (ref 0–20)

## 2024-04-16 LAB — HIGH SENSITIVITY CRP: CRP, High Sensitivity: 1.59 mg/L (ref 0.000–5.000)

## 2024-04-16 NOTE — Patient Instructions (Signed)
 Your provider has requested that you go to the basement level for lab work before leaving today. Press B on the elevator. The lab is located at the first door on the left as you exit the elevator.  Please follow up in 6 months.  _______________________________________________________  If your blood pressure at your visit was 140/90 or greater, please contact your primary care physician to follow up on this.  _______________________________________________________  If you are age 14 or older, your body mass index should be between 23-30. Your Body mass index is 34.97 kg/m. If this is out of the aforementioned range listed, please consider follow up with your Primary Care Provider.  If you are age 22 or younger, your body mass index should be between 19-25. Your Body mass index is 34.97 kg/m. If this is out of the aformentioned range listed, please consider follow up with your Primary Care Provider.   ________________________________________________________  The [facility] providers would like to encourage you to use MYCHART to communicate with providers for non-urgent requests or questions.  Due to long hold times on the telephone, sending your provider a message by [facility] may be a faster and more efficient way to get a response.  Please allow 48 business hours for a response.  Please remember that this is for non-urgent requests.  _______________________________________________________  [facility] is using a team-based approach to care.  Your team is made up of your doctor and two to three APPS. Our APPS (Nurse Practitioners and Physician Assistants) work with your physician to ensure care continuity for you. They are fully qualified to address your health concerns and develop a treatment plan. They communicate directly with your gastroenterologist to care for you. Seeing the Advanced Practice Practitioners on your physician's team can help you by facilitating care more  promptly, often allowing for earlier appointments, access to diagnostic testing, procedures, and other specialty referrals.

## 2024-04-16 NOTE — Progress Notes (Signed)
 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  She was last seen in this office Dec 13, 2027.  Please see that comprehensive dictation for details regarding her history.  Impression and recommendations at that time as follows:  ASSESSMENT:   1.  Crohn's disease involving the left colon and terminal ileum.  Currently on Humira  40 mg every 2 weeks.  Recent colonoscopy revealed healing of colonic disease and stenosis of the ileum.  Recent MR enterography revealed 10 cm segment of terminal ileal disease with upstream dilation consistent with 6 fibrosis.  Normal adalimumab  drug and antibody level testing.  She describes episodes of abdominal pain associated with vomiting.  This certainly sounds like intermittent obstructive symptoms, from her ileal disease.   2.  Arthralgias.  May or may not be associated with IBD.  Seeing rheumatology.  Recently placed on methotrexate 3.  Intolerant to 6-mercaptopurine      PLAN:   1.  At this point, I feel that Sharon Russo would benefit from surgical intervention for her Crohn's disease--as I do not believe that this element of thick stenosis will respond adequately to medical therapies.  I would imagine ileocecectomy with resumption of biologic therapy postoperatively.  I am referring her to Dr. Cy Brochure, colorectal and IBD surgical specialist, at [facility] 2.  Continue Humira  at current dose 3.  Management of arthralgias per rheumatology 4.  Await Dr. Jodean assessment.     The patient saw Dr. Brochure in consultation and subsequently underwent laparoscopic assisted ileocecal bowel resection February 21, 2024.  She was discharged home in good condition 2 days later.  She has been off Humira .  She has had a favorable postop visit and has been released by her surgeon.  Presents here for follow-up.  Patient tells me that she is doing very well  regarding her abdomen.  No pain.  Good appetite.  Regular bowel movements.  She began to develop recurrent joint pain in her hands.  She was placed back on methotrexate recently.  Otherwise well.  Accompanied by her husband.    REVIEW OF SYSTEMS:  All non-GI ROS negative except for arthritis, fatigue  Past Medical History:  Diagnosis Date   Crohn's disease (HCC)    Hyperlipidemia    Hypertension    Migraines    Obesity    Vitamin D deficiency     Past Surgical History:  Procedure Laterality Date   APPENDECTOMY      Social History Sharon Russo  reports that she has never smoked. She has never used smokeless tobacco. She reports that she does not drink alcohol  and does not use drugs.  family history includes Atrial fibrillation in her mother; Breast cancer (age of onset: 75) in her mother; CAD in her father; Dementia in her mother; Depression in her brother; Diabetes in her father and paternal grandmother; Hypertension in her maternal grandmother and mother; Kidney cancer in her mother.  No Known Allergies     PHYSICAL EXAMINATION: Vital signs: BP 124/80   Pulse 77   Ht 5' 3.25 (1.607 m)   Wt 199 lb (90.3 kg)   BMI 34.97 kg/m  General: Well-developed, well-nourished, no acute distress HEENT: Sclerae are anicteric, conjunctiva pink. Oral mucosa intact Lungs: Clear Heart: Regular Abdomen: soft, nontender, nondistended, no obvious ascites, no peritoneal signs, normal bowel sounds. No organomegaly.  Surgical incisions well-healed Extremities: No clubbing, cyanosis, or  edema Psychiatric: alert and oriented x3. Cooperative  ASSESSMENT:  1.  Ileal Crohn's disease status post laparoscopic assisted ileocecectomy February 21, 2024.  Doing well. 2.  Arthritis/arthralgias.  Methotrexate.  Being followed by rheumatology   PLAN:  1.  Long discussion today regarding management of postoperative Crohn's patients.  We discussed strategic options including resumption of medication  versus active surveillance.  We have opted for the latter.  See below 2.  CBC, comprehensive metabolic panel, CRP, ESR today 3.  Management of arthritis per rheumatology 4.  At this point, she will stay off Biologics for her IBD.  I would like to see her back in this office in 6 months for a routine checkup.  If she is doing well, I would proceed with surveillance colonoscopy to assess whether or not she has active IBD, and if so to what degree.  This will determine the best next step in terms of resumption of medical therapy versus ongoing active surveillance.  They understand to contact the office in the interim for any questions or problems. 5.  Obtain B12 level.  May need replacement therapy. A total time of 45 minutes was spent preparing to see the patient, obtaining comprehensive history, performing medically appropriate physical exam, counseling and educating the patient and her husband regarding the above listed issues, ordering multiple laboratories, arranging follow-up, and documenting clinical information in the health record

## 2024-04-17 ENCOUNTER — Other Ambulatory Visit: Payer: Self-pay

## 2024-04-17 DIAGNOSIS — K5080 Crohn's disease of both small and large intestine without complications: Secondary | ICD-10-CM

## 2024-04-28 ENCOUNTER — Encounter: Payer: Self-pay | Admitting: Internal Medicine

## 2024-05-19 ENCOUNTER — Encounter: Payer: Self-pay | Admitting: Internal Medicine

## 2024-05-26 ENCOUNTER — Ambulatory Visit: Admitting: Gastroenterology

## 2024-08-20 ENCOUNTER — Encounter: Payer: Self-pay | Admitting: Internal Medicine

## 2025-01-28 ENCOUNTER — Ambulatory Visit: Admitting: Obstetrics and Gynecology
